# Patient Record
Sex: Male | Born: 1950 | Race: White | Hispanic: No | Marital: Married | Smoking: Former smoker
Health system: Southern US, Community
[De-identification: ages and names within clinical notes are randomized; demographics above are authoritative.]

## PROBLEM LIST (undated history)

## (undated) DIAGNOSIS — J449 Chronic obstructive pulmonary disease, unspecified: Secondary | ICD-10-CM

## (undated) DIAGNOSIS — E785 Hyperlipidemia, unspecified: Secondary | ICD-10-CM

## (undated) DIAGNOSIS — M1990 Unspecified osteoarthritis, unspecified site: Secondary | ICD-10-CM

## (undated) DIAGNOSIS — I10 Essential (primary) hypertension: Secondary | ICD-10-CM

## (undated) DIAGNOSIS — Z9109 Other allergy status, other than to drugs and biological substances: Secondary | ICD-10-CM

## (undated) HISTORY — PX: VASECTOMY: SHX75

---

## 2016-09-08 ENCOUNTER — Encounter: Payer: Self-pay | Admitting: Emergency Medicine

## 2016-09-08 ENCOUNTER — Emergency Department
Admission: EM | Admit: 2016-09-08 | Discharge: 2016-09-08 | Disposition: A | Discharge status: Home / Self Care | Payer: 59 | Attending: Emergency Medicine | Admitting: Emergency Medicine

## 2016-09-08 DIAGNOSIS — W270XXA Contact with workbench tool, initial encounter: Secondary | ICD-10-CM | POA: Diagnosis not present

## 2016-09-08 DIAGNOSIS — Y999 Unspecified external cause status: Secondary | ICD-10-CM | POA: Insufficient documentation

## 2016-09-08 DIAGNOSIS — Y92838 Other recreation area as the place of occurrence of the external cause: Secondary | ICD-10-CM | POA: Insufficient documentation

## 2016-09-08 DIAGNOSIS — Z23 Encounter for immunization: Secondary | ICD-10-CM | POA: Diagnosis not present

## 2016-09-08 DIAGNOSIS — Z87891 Personal history of nicotine dependence: Secondary | ICD-10-CM | POA: Diagnosis not present

## 2016-09-08 DIAGNOSIS — Y93H9 Activity, other involving exterior property and land maintenance, building and construction: Secondary | ICD-10-CM | POA: Diagnosis not present

## 2016-09-08 DIAGNOSIS — S6991XA Unspecified injury of right wrist, hand and finger(s), initial encounter: Secondary | ICD-10-CM | POA: Diagnosis present

## 2016-09-08 DIAGNOSIS — S61411A Laceration without foreign body of right hand, initial encounter: Secondary | ICD-10-CM

## 2016-09-08 MED ORDER — TETANUS-DIPHTH-ACELL PERTUSSIS 5-2.5-18.5 LF-MCG/0.5 IM SUSP
0.5000 mL | Freq: Once | INTRAMUSCULAR | Status: AC
  Administered 2016-09-08: 0.5 mL via INTRAMUSCULAR
  Filled 2016-09-08: qty 0.5

## 2016-09-08 MED ORDER — LIDOCAINE HCL (PF) 1 % IJ SOLN
5.0000 mL | Freq: Once | INTRAMUSCULAR | Status: AC
  Administered 2016-09-08: 5 mL
  Filled 2016-09-08: qty 5

## 2016-09-08 NOTE — Discharge Instructions (Signed)
Obtain a monitor the wound for signs of infection. If you notice developing infection do not hesitate to return to your primary care or the emergency department.  Return to your primary care or the emergency department for suture removal in 7 days.

## 2016-09-08 NOTE — ED Notes (Signed)
Pt verbalized understanding of discharge instructions. NAD at this time. 

## 2016-09-08 NOTE — ED Notes (Signed)
Signature pad not working. Hard copy printed and signed by patient.  

## 2016-09-08 NOTE — ED Triage Notes (Signed)
Jabbed R hand with screwdriver approx 30 min ago. Stuck fold between thumb and finger. Bleeding controlled.

## 2016-09-08 NOTE — ED Provider Notes (Signed)
[facility] Emergency Department Provider Note   ____________________________________________   I have reviewed the triage vital signs and the nursing notes.   HISTORY  Chief Complaint Laceration    HPI Christian Shelton is a 66 y.o. male presents to emergency department with small avulsed laceration he sustained while working on a pool earlier. Patient described the flat head screwdriver slipping while working on a pool pump sustaining the laceration. Patient reports maintaining hemorrhage control medially following the injury. He denies any loss of sensation or movement to the right hand or fingers. Patient does not recall his last tetanus shot.  Patient denies fever, chills, headache, vision changes, chest pain, chest tightness, shortness of breath, abdominal pain, nausea and vomiting.  History reviewed. No pertinent past medical history.  There are no active problems to display for this patient.   History reviewed. No pertinent surgical history.  Prior to Admission medications   Not on File    Allergies Celebrex [celecoxib] and Solu-medrol [methylprednisolone]  No family history on file.  Social History Social History  Substance Use Topics  . Smoking status: Former Games developermoker  . Smokeless tobacco: Not on file  . Alcohol use Not on file    Review of Systems Constitutional: Negative for fever/chills ENT:  Negative for sore throat and for difficulty swallowing Cardiovascular: Denies chest pain. Respiratory: Denies cough. Denies shortness of breath. Gastrointestinal: No abdominal pain.  No nausea, vomiting, diarrhea. Musculoskeletal: Negative for back pain. Skin: Negative for rash. Laceration along the right hand web space between the thumb and index finger.  Neurological: Negative for headaches.  ____________________________________________   PHYSICAL EXAM:  VITAL SIGNS: ED Triage Vitals  Enc Vitals Group     BP 09/08/16 1638 (!) 127/92       Pulse Rate 09/08/16 1638 94     Resp 09/08/16 1638 18     Temp 09/08/16 1638 (!) 97.5 F (36.4 C)     Temp Source 09/08/16 1638 Oral     SpO2 09/08/16 1638 94 %     Weight 09/08/16 1639 157 lb (71.2 kg)     Height 09/08/16 1639 5\' 5"  (1.651 m)     Head Circumference --      Peak Flow --      Pain Score 09/08/16 1638 2     Pain Loc --      Pain Edu? --      Excl. in GC? --     Constitutional: Alert and oriented. Well appearing and in no acute distress.  Head: Normocephalic and atraumatic. Eyes: Conjunctivae are normal. PERRL. Cardiovascular: Normal rate, regular rhythm. Normal distal pulses. Respiratory: Normal respiratory effort.  Gastrointestinal: Soft and nontender. Musculoskeletal: Right thumb and index finger range of motion, sensation and strength intact. No extensor tendon involvement. Nontender with normal range of motion in all extremities. Neurologic: Normal speech and language. Skin:  Skin is warm, dry and intact. No rash noted. Laceration approximately 1.5 cm, semicircle skin flap, irregular borders,along the right hand web space between the thumb and index finger. Psychiatric: Mood and affect are normal.  ____________________________________________   LABS (all labs ordered are listed, but only abnormal results are displayed)  Labs Reviewed - No data to display ____________________________________________  EKG none ____________________________________________  RADIOLOGY none ____________________________________________   PROCEDURES  Procedure(s) performed: LACERATION REPAIR Performed by: Clois Comberraci M Ayana Imhof Authorized by: Clois Comberraci M Cintya Daughety Consent: Verbal consent obtained. Risks and benefits: risks, benefits and alternatives were discussed Consent given by: patient Patient identity  confirmed: provided demographic data Prepped and Draped in normal sterile fashion Wound explored  Laceration Location: Right hand web space between right thumb and index  finger.  Laceration Length: 1.5 cm, semicircle skin flap, irregular borders.  No Foreign Bodies seen or palpated  Anesthesia: local infiltration  Local anesthetic: lidocaine 1%  Anesthetic total: 3.0 ml  Irrigation method: syringe Amount of cleaning: standard  Skin closure: Ethilon 5-0 and Vicryl rapide 5-0 Number of sutures: (4 sutures) Ethilon 5-0;  (2 sutures) Vicryl rapide 5-0   Technique: Subcutaneous and simple interrupted   Patient tolerance: Patient tolerated the procedure well with no immediate complications.   Critical Care performed: no ____________________________________________   INITIAL IMPRESSION / ASSESSMENT AND PLAN / ED COURSE  Pertinent labs & imaging results that were available during my care of the patient were reviewed by me and considered in my medical decision making (see chart for details).   Patient sustained a laceration along the webspace of the right hand between the right thumb and index finger.  Assessment confirmed movement and sensation of the digit before and after wound closure. Laceration required suture closure as noted above. Patient tolerated procedure well. Pt instructed to keep wound clean and dry and will return to the emergency department or PCP for suture removal in 7 days. Patient also instructed to watch for signs of infection and return to his PCP or the emergency department if changes are noted. Patient informed of clinical course, understand medical decision-making process, and agree with plan.      ____________________________________________   FINAL CLINICAL IMPRESSION(S) / ED DIAGNOSES  Final diagnoses:  Laceration of right hand without foreign body, initial encounter       NEW MEDICATIONS STARTED DURING THIS VISIT:  New Prescriptions   No medications on file     Note:  This document was prepared using Dragon voice recognition software and may include unintentional dictation errors.    Percell BostonLittle, Zeth Buday M,  PA-C 09/08/16 1852    Jeanmarie PlantMcShane, James A, MD 09/09/16 [phone]

## 2018-12-12 ENCOUNTER — Other Ambulatory Visit: Payer: Self-pay | Admitting: *Deleted

## 2018-12-12 DIAGNOSIS — Z20822 Contact with and (suspected) exposure to covid-19: Secondary | ICD-10-CM

## 2018-12-13 LAB — NOVEL CORONAVIRUS, NAA: SARS-CoV-2, NAA: NOT DETECTED

## 2018-12-23 ENCOUNTER — Other Ambulatory Visit: Payer: Self-pay

## 2018-12-23 DIAGNOSIS — Z20822 Contact with and (suspected) exposure to covid-19: Secondary | ICD-10-CM

## 2018-12-25 LAB — NOVEL CORONAVIRUS, NAA: SARS-CoV-2, NAA: DETECTED — AB

## 2019-04-15 ENCOUNTER — Other Ambulatory Visit: Payer: Self-pay | Admitting: Internal Medicine

## 2019-04-15 DIAGNOSIS — S32010A Wedge compression fracture of first lumbar vertebra, initial encounter for closed fracture: Secondary | ICD-10-CM

## 2019-04-23 ENCOUNTER — Ambulatory Visit
Admission: RE | Admit: 2019-04-23 | Discharge: 2019-04-23 | Disposition: A | Discharge status: Home / Self Care | Payer: Managed Care, Other (non HMO) | Source: Ambulatory Visit | Attending: Internal Medicine | Admitting: Internal Medicine

## 2019-04-23 ENCOUNTER — Other Ambulatory Visit: Payer: Self-pay

## 2019-04-23 DIAGNOSIS — S32010A Wedge compression fracture of first lumbar vertebra, initial encounter for closed fracture: Secondary | ICD-10-CM | POA: Diagnosis not present

## 2020-04-20 ENCOUNTER — Other Ambulatory Visit: Payer: Self-pay | Admitting: Internal Medicine

## 2020-04-20 DIAGNOSIS — J849 Interstitial pulmonary disease, unspecified: Secondary | ICD-10-CM

## 2020-05-06 ENCOUNTER — Ambulatory Visit: Payer: Managed Care, Other (non HMO)

## 2020-05-11 ENCOUNTER — Other Ambulatory Visit: Payer: Self-pay

## 2020-05-11 ENCOUNTER — Ambulatory Visit
Admission: RE | Admit: 2020-05-11 | Discharge: 2020-05-11 | Disposition: A | Discharge status: Home / Self Care | Payer: Managed Care, Other (non HMO) | Source: Ambulatory Visit | Attending: Internal Medicine | Admitting: Internal Medicine

## 2020-05-11 DIAGNOSIS — J849 Interstitial pulmonary disease, unspecified: Secondary | ICD-10-CM | POA: Diagnosis present

## 2020-05-11 MED ORDER — IOHEXOL 300 MG/ML  SOLN
75.0000 mL | Freq: Once | INTRAMUSCULAR | Status: AC | PRN
  Administered 2020-05-11: 75 mL via INTRAVENOUS

## 2021-02-19 ENCOUNTER — Ambulatory Visit
Admission: RE | Admit: 2021-02-19 | Discharge: 2021-02-19 | Disposition: A | Discharge status: Home / Self Care | Payer: Managed Care, Other (non HMO) | Source: Ambulatory Visit

## 2021-02-19 ENCOUNTER — Ambulatory Visit (INDEPENDENT_AMBULATORY_CARE_PROVIDER_SITE_OTHER): Payer: Managed Care, Other (non HMO)

## 2021-02-19 VITALS — BP 166/92 | HR 74 | Temp 98.1°F | Resp 18

## 2021-02-19 DIAGNOSIS — J441 Chronic obstructive pulmonary disease with (acute) exacerbation: Secondary | ICD-10-CM

## 2021-02-19 DIAGNOSIS — R059 Cough, unspecified: Secondary | ICD-10-CM | POA: Diagnosis not present

## 2021-02-19 DIAGNOSIS — Z8616 Personal history of COVID-19: Secondary | ICD-10-CM | POA: Diagnosis not present

## 2021-02-19 HISTORY — DX: Unspecified osteoarthritis, unspecified site: M19.90

## 2021-02-19 HISTORY — DX: Hyperlipidemia, unspecified: E78.5

## 2021-02-19 HISTORY — DX: Essential (primary) hypertension: I10

## 2021-02-19 HISTORY — DX: Other allergy status, other than to drugs and biological substances: Z91.09

## 2021-02-19 HISTORY — DX: Chronic obstructive pulmonary disease, unspecified: J44.9

## 2021-02-19 MED ORDER — PREDNISONE 10 MG PO TABS: ORAL_TABLET | ORAL | 0 refills | Status: DC

## 2021-02-19 MED ORDER — BENZONATATE 100 MG PO CAPS: 100.0000 mg | ORAL_CAPSULE | Freq: Three times a day (TID) | ORAL | 0 refills | Status: DC

## 2021-02-19 MED ORDER — DOXYCYCLINE HYCLATE 100 MG PO CAPS: 100.0000 mg | ORAL_CAPSULE | Freq: Two times a day (BID) | ORAL | 0 refills | Status: DC

## 2021-02-19 NOTE — ED Triage Notes (Signed)
Pt presents with cough and chest congestion x 3 weeks. Pt tested positive for Covid at home test on 12/11.

## 2021-02-19 NOTE — ED Provider Notes (Addendum)
Christian Shelton    CSN: [id number] Arrival date & time: 02/19/21  6295      History   Chief Complaint Chief Complaint  Patient presents with   Cough    HPI Christian Shelton is a 71 y.o. male. Pt presents with his son.   HPI  Cough: Patient with a PMH significant for COPD presents with cough and chest congestion for the past 3 weeks after having COVID-19. Cough his productive with green, brown sputum. No hemoptysis. He has had no fevers, chest pain or severe SOB. He has tired his normal inhalers along with roboussin and mucinex for symptoms.    Past Medical History:  Diagnosis Date   Benign hypertension    COPD (chronic obstructive pulmonary disease) (HCC)    Environmental allergies    Hyperlipemia    Osteoarthritis     There are no problems to display for this patient.   Past Surgical History:  Procedure Laterality Date   VASECTOMY       Home Medications    Prior to Admission medications   Medication Sig Start Date End Date Taking? Authorizing Provider  atorvastatin (LIPITOR) 20 MG tablet Take 1 tablet by mouth daily. 04/07/20  Yes [provider]  budesonide-formoterol (SYMBICORT) 160-4.5 MCG/ACT inhaler INHALE 2 INHALATIONS BY  MOUTH INTO THE LUNGS TWICE  DAILY 04/14/20  Yes [provider]  ipratropium-albuterol (DUONEB) 0.5-2.5 (3) MG/3ML SOLN USE 1 VIAL VIA NEBULIZER 4  TIMES DAILY 01/20/20  Yes [provider]  magnesium oxide (MAG-OX) 400 MG tablet Take by mouth. 10/24/20 10/24/21 Yes [provider]  montelukast (SINGULAIR) 10 MG tablet Take by mouth. 05/20/20 05/20/21 Yes [provider]  naproxen (NAPROSYN) 500 MG tablet Take 1 tablet by mouth 2 (two) times daily with a meal. 04/06/20  Yes [provider]  omeprazole (PRILOSEC) 40 MG capsule Take by mouth. 10/24/20 10/24/21 Yes [provider]  sildenafil (REVATIO) 20 MG tablet 3-5 tablets daily as needed for ED 10/24/20  Yes [provider]   albuterol (VENTOLIN HFA) 108 (90 Base) MCG/ACT inhaler Inhale into the lungs. 02/14/21   [provider]  budesonide (PULMICORT) 0.5 MG/2ML nebulizer solution Take by nebulization. 12/16/20   [provider]  colchicine 0.6 MG tablet Take 0.6 mg by mouth daily. 09/23/20   [provider]    Family History History reviewed. No pertinent family history.  Social History Social History   Tobacco Use   Smoking status: Former   Smokeless tobacco: Never  Building services engineer Use: Never used  Substance Use Topics   Alcohol use: Yes   Drug use: Never     Allergies   Celebrex [celecoxib] and Solu-medrol [methylprednisolone]   Review of Systems Review of Systems  As stated above in HPI Physical Exam Triage Vital Signs ED Triage Vitals  Enc Vitals Group     BP 02/19/21 0938 (!) 166/92     Pulse Rate 02/19/21 0938 74     Resp 02/19/21 0938 18     Temp 02/19/21 0938 98.1 F (36.7 C)     Temp Source 02/19/21 0938 Oral     SpO2 02/19/21 0938 92 %     Weight --      Height --      Head Circumference --      Peak Flow --      Pain Score 02/19/21 0940 0     Pain Loc --      Pain Edu? --  Excl. in GC? --    No data found.  Updated Vital Signs BP (!) 166/92 (BP Location: Left Arm)    Pulse 74    Temp 98.1 F (36.7 C) (Oral)    Resp 18    SpO2 92%   Physical Exam Vitals and nursing note reviewed.  Constitutional:      General: He is not in acute distress.    Appearance: He is well-developed.  HENT:     Head: Normocephalic and atraumatic.     Right Ear: Tympanic membrane normal.     Left Ear: Tympanic membrane normal.     Nose: Congestion present. No rhinorrhea.     Mouth/Throat:     Mouth: Mucous membranes are moist.     Pharynx: Oropharynx is clear. No oropharyngeal exudate or posterior oropharyngeal erythema.  Eyes:     Conjunctiva/sclera: Conjunctivae normal.  Cardiovascular:     Rate and Rhythm: Normal rate and regular rhythm.      Heart sounds: No murmur heard. Pulmonary:     Effort: Pulmonary effort is normal. No respiratory distress.     Breath sounds: Wheezing and rhonchi present.  Abdominal:     Palpations: Abdomen is soft.     Tenderness: There is no abdominal tenderness.  Musculoskeletal:        General: No swelling.     Cervical back: Neck supple.  Lymphadenopathy:     Cervical: No cervical adenopathy.  Skin:    General: Skin is warm and dry.     Capillary Refill: Capillary refill takes less than 2 seconds.  Neurological:     Mental Status: He is alert.  Psychiatric:        Mood and Affect: Mood normal.     UC Treatments / Results  Labs (all labs ordered are listed, but only abnormal results are displayed) Labs Reviewed - No data to display  EKG   Radiology No results found.  Procedures Procedures (including critical care time)  Medications Ordered in UC Medications - No data to display  Initial Impression / Assessment and Plan / UC Course  I have reviewed the triage vital signs and the nursing notes.  Pertinent labs & imaging results that were available during my care of the patient were reviewed by me and considered in my medical decision making (see chart for details).     New. COPD flare. They as for nipple markers for x ray which we do not have- follow up x ray image with PCP in 1 month. X ray shows no suspected pneumonia but given history and smoking status will treat concurrently with azithromycin for coverage for atypical bacteria. Treating with prednisone (which he has done well with in the past per patient and his son along with tessalon. Discussed red flag signs and symptoms along with O2 monitoring. Follow up with PCP our office,  or pulmonology PRN. Of note he reports history of white coat syndrome.   Final Clinical Impressions(s) / UC Diagnoses   Final diagnoses:  None   Discharge Instructions   None    ED Prescriptions   None    PDMP not reviewed this  encounter.   Rushie Chestnut, PA-C 02/19/21 1024    Rushie Chestnut, PA-C 02/19/21 1027

## 2021-02-21 IMAGING — MR MR LUMBAR SPINE W/O CM
5 series · 30 of 48 positions shown · non-contrast
Comparison: None.

CLINICAL DATA: Low back pain. Evaluate compression fracture seen on
x-ray.

EXAM:
MRI LUMBAR SPINE WITHOUT CONTRAST
TECHNIQUE: Multiplanar, multisequence MR imaging of the lumbar spine was
performed. No intravenous contrast was administered.

[Series 5: T2 · sagittal · 4.0mm · 0.81mm/px · 6 of 19 slices shown (1 of 2)]
[im 1/19]
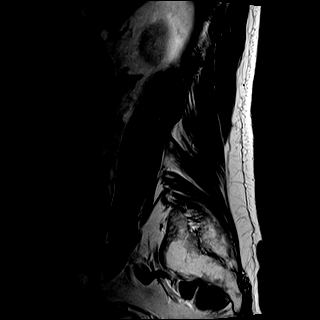
[im 4/19]
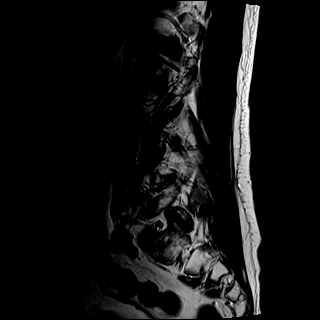
[im 8/19]
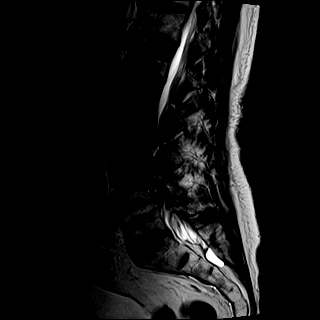
[im 11/19]
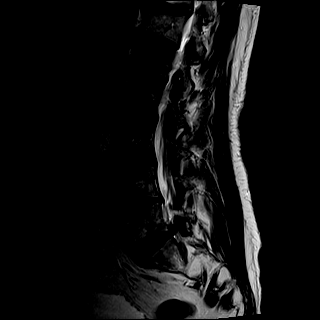
[im 15/19]
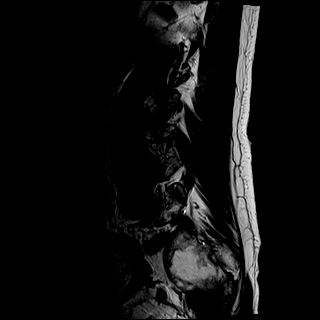
[im 19/19]
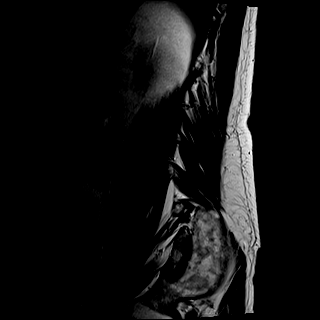

[Series 6: T1 · sagittal · 4.0mm · 0.81mm/px · 7 of 19 slices shown (1 of 2)]
[im 1/19]
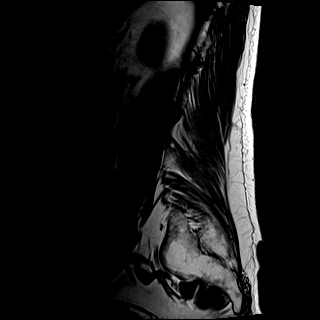
[im 4/19]
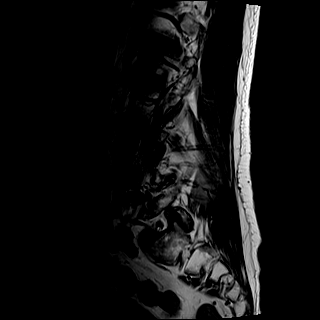
[im 7/19]
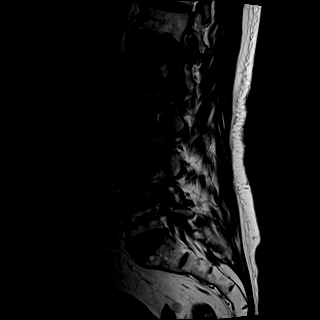
[im 10/19]
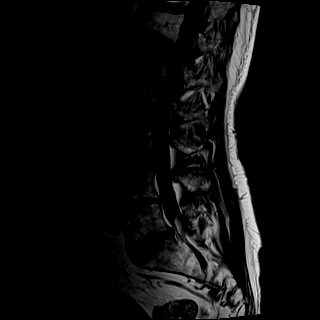
[im 13/19]
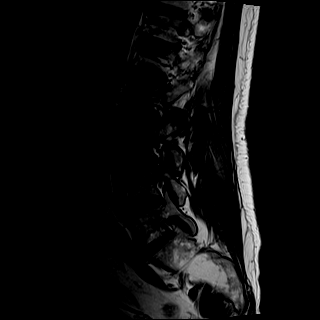
[im 16/19]
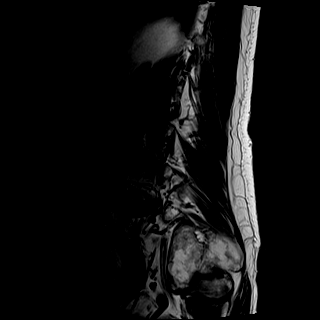
[im 19/19]
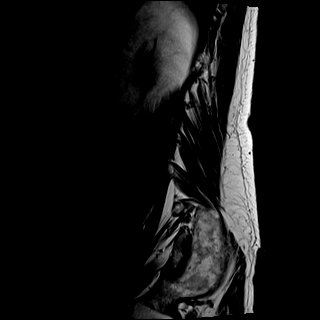

[Series 7: STIR · sagittal · 4.0mm · 0.41mm/px · 1 of 19 slices shown]
[im 1/19]
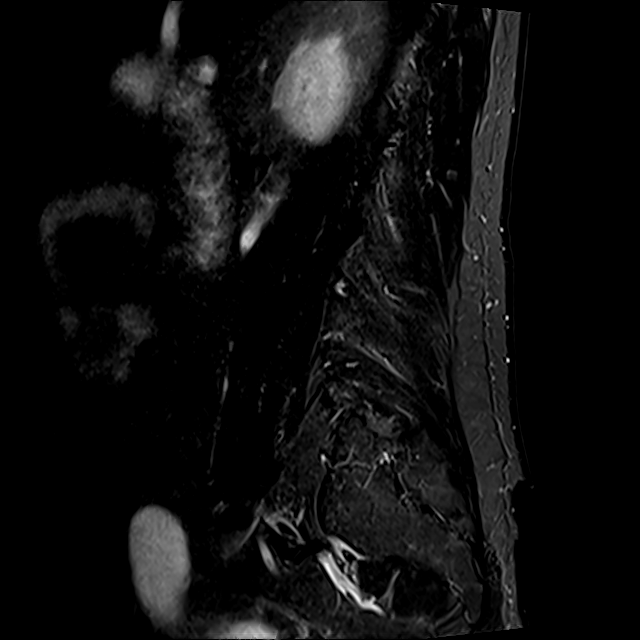

[Series 8: T2 · axial · 4.0mm · 0.78mm/px · z∈[-97,+123]mm · 8 of 40 slices shown (2 of 2)]
[im 1/40]
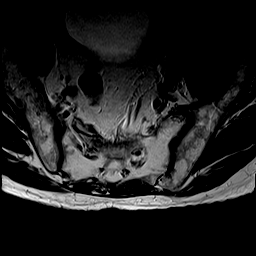
[im 7/40]
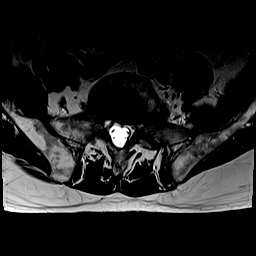
[im 13/40]
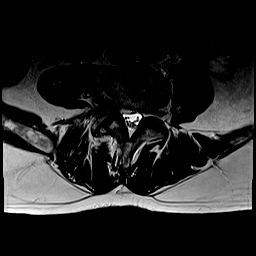
[im 19/40]
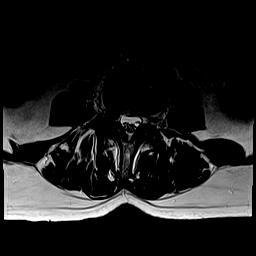
[im 22/40]
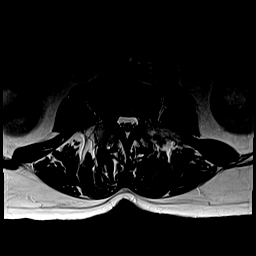
[im 28/40]
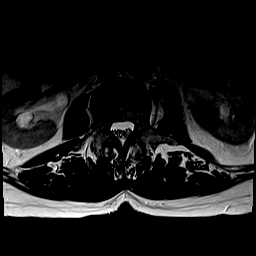
[im 34/40]
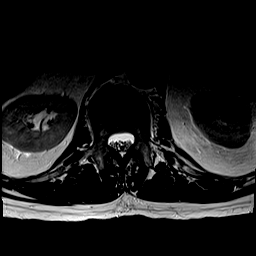
[im 40/40]
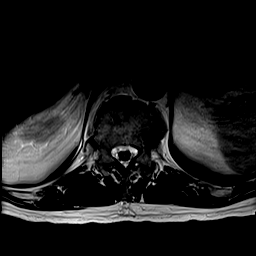

[Series 9: T1 · axial · 4.0mm · 0.39mm/px · z∈[-97,+123]mm · 8 of 40 slices shown (2 of 2)]
[im 1/40]
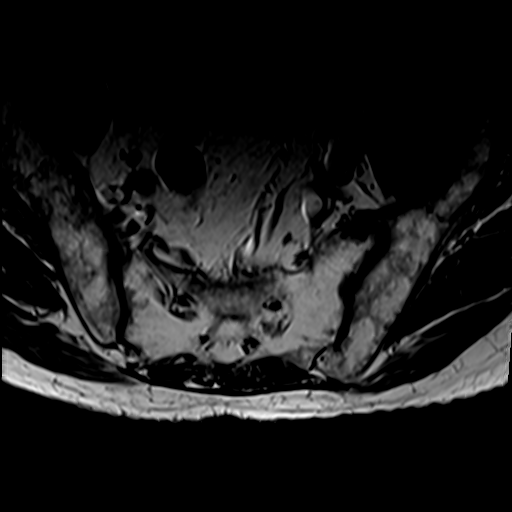
[im 7/40]
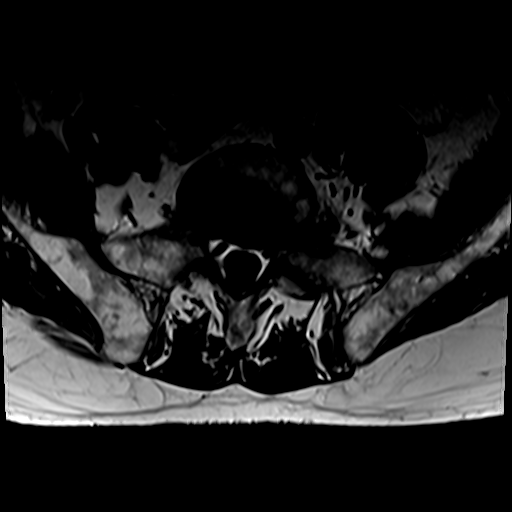
[im 13/40]
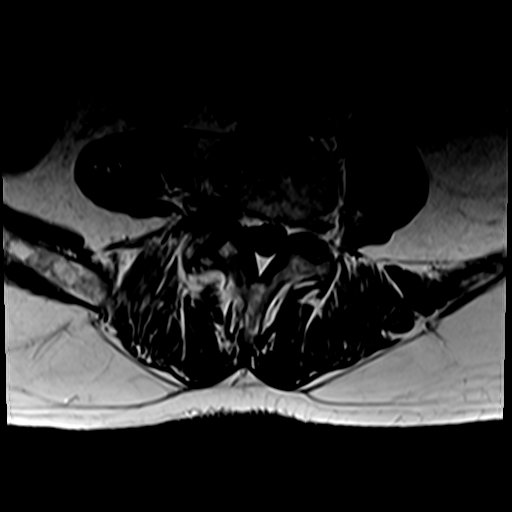
[im 19/40]
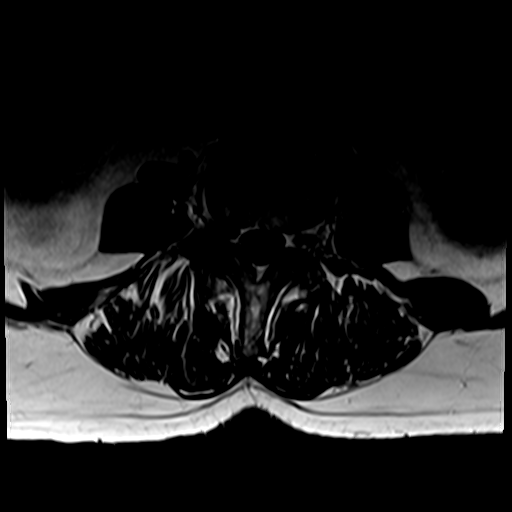
[im 22/40]
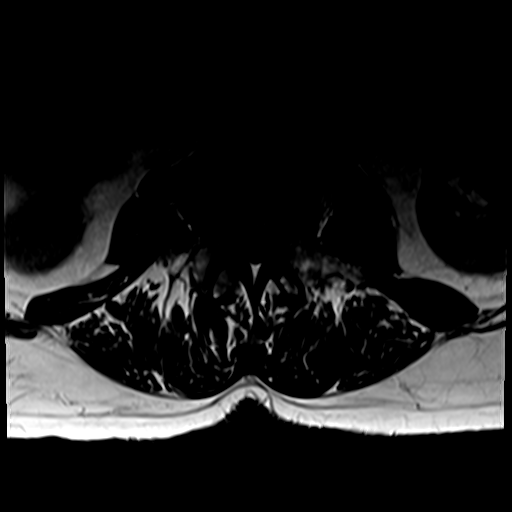
[im 28/40]
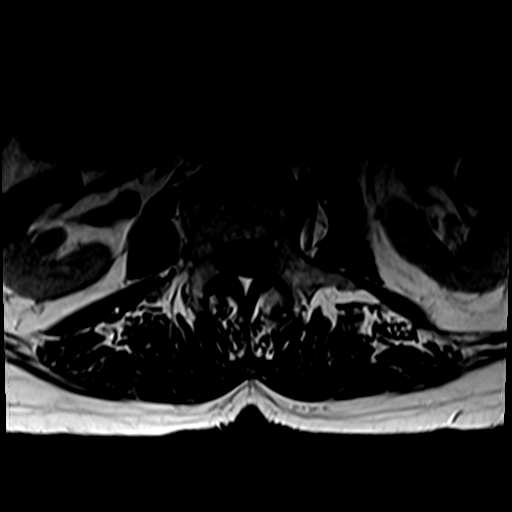
[im 34/40]
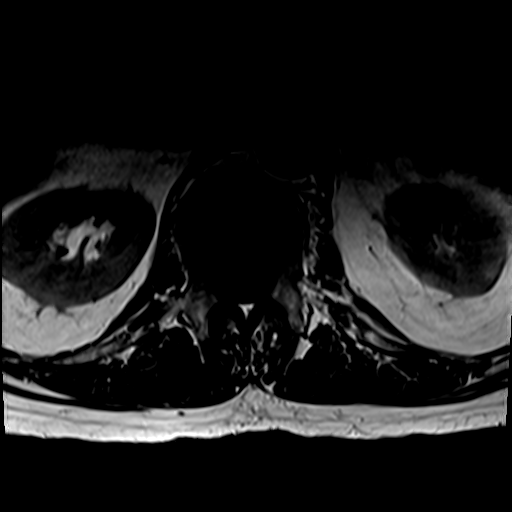
[im 40/40]
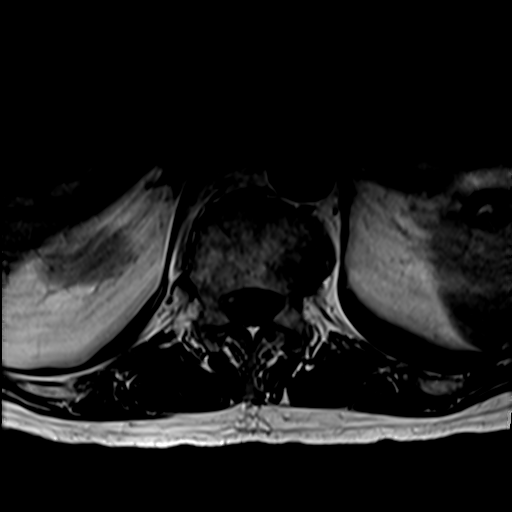

[30 of 48 positions shown; findings below may reference images not displayed]

FINDINGS: Segmentation:  Standard.

Alignment:  Physiologic.

Vertebrae: Compression fracture of the superior endplate of the L1
vertebral body with 50% loss of vertebral body height and associated
marrow edema, consistent with acute/subacute fracture.

Compression fracture of the superior endplate of T12 with mild
wedging and no marrow edema, consistent with chronic event. Minimal
endplate compression fractures at L3 and 4 without associated edema,
also chronic.

Endplate degenerative changes at L4-5 with loss of disc height and
osteophytes.

Conus medullaris and cauda equina: Conus extends to the L1 level.
Conus and cauda equina appear normal.

Paraspinal and other soft tissues: Infrarenal abdominal AA or
aneurysm measuring up to 4.2 cm with intramural hematoma.

Disc levels:

T12-L1: Shallow disc bulge and mild facet degenerative changes
resulting in minimal narrowing of the left neural foramen. No spinal
canal stenosis.

L1-2: Disc bulge and facet degenerative changes resulting in mild
left neural foraminal narrowing. No significant spinal canal
stenosis.

L2-3: Disc bulge, facet degenerative changes and ligamentum flavum
redundancy resulting in mild narrowing of the spinal canal with
narrowing of the left subarticular zone, mild right and moderate
left neural foraminal narrowing.

L3-4: Disc bulge with superimposed right foraminal disc protrusion,
facet degenerative changes and ligamentum flavum redundancy
resulting in mild spinal canal stenosis, narrowing of the
subarticular zones, right greater than left, moderate right mild
left neural foraminal narrowing.

L4-5: Right asymmetric disc bulge, mild facet degenerative changes
and ligamentum flavum redundancy resulting mild-to-moderate spinal
canal stenosis, effacement of the right subarticular zone, severe
right and moderate left neural foraminal narrowing.

L5-S1: Left asymmetric disc bulge and mild facet degenerative
changes resulting in narrowing of the left subarticular zone and
moderate narrowing of the left neural foramen. No spinal canal
stenosis.
IMPRESSION: 1. Acute/subacute L1 superior endplate compression fracture with 50%
loss of vertebral body height.
2. Chronic compression deformities of T12, L3, and 4 vertebral
bodies.
3. Multilevel degenerative changes of the lumbar spine with varying
degrees of spinal canal and neural foraminal narrowing, worst at
L4-5 where there is mild-to-moderate spinal canal stenosis,
effacement of the right subarticular zone, severe right and moderate
left neural foraminal narrowing.
4. Infrarenal abdominal aortic or aneurysm measuring up to 4.2 cm
with intramural hematoma. Recommend followup by ultrasound in 3
years. This recommendation follows ACR consensus guidelines: White
Paper of the ACR Incidental Findings Committee II on Vascular
Findings. [HOSPITAL] 5347; 10:

These results will be called to the ordering clinician or
representative by the Radiologist Assistant, and communication
documented in the PACS or [REDACTED].

## 2021-10-23 ENCOUNTER — Other Ambulatory Visit: Payer: Self-pay

## 2021-10-23 ENCOUNTER — Emergency Department
Admission: EM | Admit: 2021-10-23 | Discharge: 2021-10-23 | Disposition: A | Discharge status: Home / Self Care | Payer: Managed Care, Other (non HMO) | Attending: Emergency Medicine | Admitting: Emergency Medicine

## 2021-10-23 ENCOUNTER — Emergency Department: Payer: Managed Care, Other (non HMO)

## 2021-10-23 DIAGNOSIS — Z20822 Contact with and (suspected) exposure to covid-19: Secondary | ICD-10-CM | POA: Insufficient documentation

## 2021-10-23 DIAGNOSIS — R55 Syncope and collapse: Secondary | ICD-10-CM

## 2021-10-23 DIAGNOSIS — S0101XA Laceration without foreign body of scalp, initial encounter: Secondary | ICD-10-CM | POA: Diagnosis not present

## 2021-10-23 DIAGNOSIS — J449 Chronic obstructive pulmonary disease, unspecified: Secondary | ICD-10-CM | POA: Diagnosis not present

## 2021-10-23 DIAGNOSIS — S0990XA Unspecified injury of head, initial encounter: Secondary | ICD-10-CM | POA: Diagnosis present

## 2021-10-23 DIAGNOSIS — Z23 Encounter for immunization: Secondary | ICD-10-CM | POA: Diagnosis not present

## 2021-10-23 DIAGNOSIS — Y990 Civilian activity done for income or pay: Secondary | ICD-10-CM | POA: Diagnosis not present

## 2021-10-23 DIAGNOSIS — I10 Essential (primary) hypertension: Secondary | ICD-10-CM | POA: Insufficient documentation

## 2021-10-23 DIAGNOSIS — X58XXXA Exposure to other specified factors, initial encounter: Secondary | ICD-10-CM | POA: Diagnosis not present

## 2021-10-23 LAB — BASIC METABOLIC PANEL
Anion gap: 9 (ref 5–15)
BUN: 14 mg/dL (ref 8–23)
CO2: 29 mmol/L (ref 22–32)
Calcium: 9.1 mg/dL (ref 8.9–10.3)
Chloride: 101 mmol/L (ref 98–111)
Creatinine, Ser: 1.02 mg/dL (ref 0.61–1.24)
GFR, Estimated: >60 mL/min (ref >60)
Glucose, Bld: 132 mg/dL — ABNORMAL HIGH (ref 70–99)
Potassium: 3.7 mmol/L (ref 3.5–5.1)
Sodium: 139 mmol/L (ref 135–145)

## 2021-10-23 LAB — CBC
HCT: 49.7 % (ref 39.0–52.0)
Hemoglobin: 16.7 g/dL (ref 13.0–17.0)
MCH: 32.2 pg (ref 26.0–34.0)
MCHC: 33.6 g/dL (ref 30.0–36.0)
MCV: 95.8 fL (ref 80.0–100.0)
Platelets: 199 10*3/uL (ref 150–400)
RBC: 5.19 MIL/uL (ref 4.22–5.81)
RDW: 12.6 % (ref 11.5–15.5)
WBC: 6.4 10*3/uL (ref 4.0–10.5)
nRBC: 0 % (ref 0.0–0.2)

## 2021-10-23 LAB — TROPONIN I (HIGH SENSITIVITY): Troponin I (High Sensitivity): 3 ng/L (ref <18)

## 2021-10-23 LAB — SARS CORONAVIRUS 2 BY RT PCR: SARS Coronavirus 2 by RT PCR: NEGATIVE

## 2021-10-23 MED ORDER — ACETAMINOPHEN 500 MG PO TABS
1000.0000 mg | ORAL_TABLET | Freq: Once | ORAL | Status: AC
  Administered 2021-10-23: 1000 mg via ORAL
  Filled 2021-10-23: qty 2

## 2021-10-23 MED ORDER — TETANUS-DIPHTH-ACELL PERTUSSIS 5-2.5-18.5 LF-MCG/0.5 IM SUSY
0.5000 mL | PREFILLED_SYRINGE | Freq: Once | INTRAMUSCULAR | Status: AC
  Administered 2021-10-23: 0.5 mL via INTRAMUSCULAR
  Filled 2021-10-23: qty 0.5

## 2021-10-23 NOTE — ED Triage Notes (Signed)
Pt states woke up this morning "In a cold sweat" went to work, fainted at work. Pt states he felt dizzy before he had a syncopal episode. Pt with laceration noted to right parietal scalp and hematoma to right parietal scalp.

## 2021-10-23 NOTE — ED Provider Notes (Signed)
[facility] Provider Note    Event Date/Time   First MD Initiated Contact with Patient 10/23/21 0703     (approximate)   History   Chief Complaint: Fall, Dizziness, and Loss of Consciousness   HPI  Christian Shelton is a 71 y.o. male with a history of COPD hyperlipidemia hypertension who comes the ED due to syncope and head injury.  He reports being in his usual state of health until waking up this morning when he felt he had chills and sweats.  He went to work this morning, and while standing in the staff lounge waiting to clock in to start the day, he got lightheaded and passed out.  Complains of generalized headache that started after regaining consciousness.  Denies any preceding symptoms such as chest pain shortness of breath abdominal pain back pain double vision severe headache or neck pain.  Currently feels back to normal except for headache.  Does not take blood thinners.  Last tetanus shot was more than 5 years ago.     Physical Exam   Triage Vital Signs: ED Triage Vitals  Enc Vitals Group     BP 10/23/21 0644 (!) 154/88     Pulse Rate 10/23/21 0644 68     Resp 10/23/21 0644 18     Temp 10/23/21 0826 98.2 F (36.8 C)     Temp Source 10/23/21 0644 Oral     SpO2 10/23/21 0644 95 %     Weight 10/23/21 0644 154 lb (69.9 kg)     Height 10/23/21 0644 5\' 6"  (1.676 m)     Head Circumference --      Peak Flow --      Pain Score 10/23/21 0644 10     Pain Loc --      Pain Edu? --      Excl. in GC? --     Most recent vital signs: Vitals:   10/23/21 0644 10/23/21 0826  BP: (!) 154/88   Pulse: 68   Resp: 18   Temp:  98.2 F (36.8 C)  SpO2: 95%     General: Awake, no distress.  CV:  Good peripheral perfusion.  Regular rate and rhythm Resp:  Normal effort.  Clear to auscultation bilaterally Abd:  No distention.  Soft nontender Other:  Full range of motion all extremities. Left scalp has a 4 cm tender subcutaneous hematoma.  No bleeding, not  expanding.  Right temporal scalp has a 4 cm linear laceration which is hemostatic.  No deformity/depression or point tenderness   ED Results / Procedures / Treatments   Labs (all labs ordered are listed, but only abnormal results are displayed) Labs Reviewed  BASIC METABOLIC PANEL - Abnormal; Notable for the following components:      Result Value   Glucose, Bld 132 (*)    All other components within normal limits  SARS CORONAVIRUS 2 BY RT PCR  CBC  TROPONIN I (HIGH SENSITIVITY)  TROPONIN I (HIGH SENSITIVITY)     EKG    RADIOLOGY CT head interpreted by me, negative for intracranial hemorrhage.  Radiology report reviewed.  CT cervical spine unremarkable   PROCEDURES:  .1-3 Lead EKG Interpretation  Performed by: 12/23/21, MD Authorized by: Sharman Cheek, MD     Interpretation: normal     ECG rate:  70   ECG rate assessment: normal     Rhythm: sinus rhythm     Ectopy: none     Conduction: normal  Comments:        Marland KitchenMarland KitchenLaceration Repair  Date/Time: 10/23/2021 9:06 AM  Performed by: Sharman Cheek, MD Authorized by: Sharman Cheek, MD   Consent:    Consent obtained:  Verbal   Consent given by:  Patient   Risks discussed:  Need for additional repair and poor wound healing Universal protocol:    Patient identity confirmed:  Verbally with patient Anesthesia:    Anesthesia method:  None Laceration details:    Location:  Scalp   Scalp location:  R temporal   Length (cm):  4 Pre-procedure details:    Preparation:  Imaging obtained to evaluate for foreign bodies and patient was prepped and draped in usual sterile fashion Exploration:    Hemostasis achieved with:  Direct pressure   Imaging outcome: foreign body not noted     Wound exploration: entire depth of wound visualized     Wound extent: no fascia violation noted, no foreign bodies/material noted, no muscle damage noted, no nerve damage noted, no tendon damage noted, no underlying fracture  noted and no vascular damage noted     Contaminated: no   Treatment:    Area cleansed with:  Povidone-iodine   Amount of cleaning:  Standard   Irrigation solution:  Sterile saline   Irrigation method:  Pressure wash   Visualized foreign bodies/material removed: no     Debridement:  Minimal   Undermining:  None   Scar revision: no   Skin repair:    Repair method:  Tissue adhesive Approximation:    Approximation:  Close Repair type:    Repair type:  Simple Post-procedure details:    Dressing:  Open (no dressing)   Procedure completion:  Tolerated well, no immediate complications    MEDICATIONS ORDERED IN ED: Medications  acetaminophen (TYLENOL) tablet 1,000 mg (1,000 mg Oral Given 10/23/21 0817)  Tdap (BOOSTRIX) injection 0.5 mL (0.5 mLs Intramuscular Given 10/23/21 0817)     IMPRESSION / MDM / ASSESSMENT AND PLAN / ED COURSE  I reviewed the triage vital signs and the nursing notes.                              Differential diagnosis includes, but is not limited to, intracranial hemorrhage, skull fracture, C-spine fracture, dehydration, electrolyte abnormality, AKI, COVID/viral illness  Patient's presentation is most consistent with acute complicated illness / injury requiring diagnostic workup.  Patient presents with syncope which occurred after starting some mild constitutional symptoms this morning.  No focal pain syndrome.  Presentation is noncardiac, doubt ACS PE dissection or stroke.  CT imaging negative for any serious injuries.  Tetanus updated.  Wound cleaned and repaired with tissue adhesive after discussion of options with the patient.  We will send COVID test, prescribe Paxlovid if needed.       FINAL CLINICAL IMPRESSION(S) / ED DIAGNOSES   Final diagnoses:  Syncope, unspecified syncope type  Scalp laceration, initial encounter     Rx / DC Orders   ED Discharge Orders     None        Note:  This document was prepared using Dragon voice  recognition software and may include unintentional dictation errors.   Sharman Cheek, MD 10/23/21 [phone]

## 2021-10-23 NOTE — ED Notes (Signed)
Assumed care, pt alert and oriented. Family at bedside. Reports headache from fall. MD aware will monitor.

## 2022-02-09 ENCOUNTER — Emergency Department: Payer: Managed Care, Other (non HMO)

## 2022-02-09 ENCOUNTER — Inpatient Hospital Stay
Admission: EM | Admit: 2022-02-09 | Discharge: 2022-02-11 | DRG: 190 | Disposition: A | Discharge status: Home / Self Care | Payer: Managed Care, Other (non HMO) | Attending: Internal Medicine | Admitting: Internal Medicine

## 2022-02-09 DIAGNOSIS — J9621 Acute and chronic respiratory failure with hypoxia: Secondary | ICD-10-CM | POA: Diagnosis present

## 2022-02-09 DIAGNOSIS — I10 Essential (primary) hypertension: Secondary | ICD-10-CM | POA: Diagnosis present

## 2022-02-09 DIAGNOSIS — J441 Chronic obstructive pulmonary disease with (acute) exacerbation: Secondary | ICD-10-CM | POA: Diagnosis present

## 2022-02-09 DIAGNOSIS — E785 Hyperlipidemia, unspecified: Secondary | ICD-10-CM | POA: Diagnosis present

## 2022-02-09 DIAGNOSIS — Z7951 Long term (current) use of inhaled steroids: Secondary | ICD-10-CM | POA: Diagnosis not present

## 2022-02-09 DIAGNOSIS — J9601 Acute respiratory failure with hypoxia: Principal | ICD-10-CM

## 2022-02-09 DIAGNOSIS — J440 Chronic obstructive pulmonary disease with acute lower respiratory infection: Secondary | ICD-10-CM | POA: Diagnosis present

## 2022-02-09 DIAGNOSIS — E8809 Other disorders of plasma-protein metabolism, not elsewhere classified: Secondary | ICD-10-CM | POA: Diagnosis present

## 2022-02-09 DIAGNOSIS — Z888 Allergy status to other drugs, medicaments and biological substances status: Secondary | ICD-10-CM | POA: Diagnosis not present

## 2022-02-09 DIAGNOSIS — E872 Acidosis, unspecified: Secondary | ICD-10-CM | POA: Diagnosis present

## 2022-02-09 DIAGNOSIS — M1990 Unspecified osteoarthritis, unspecified site: Secondary | ICD-10-CM | POA: Diagnosis present

## 2022-02-09 DIAGNOSIS — J180 Bronchopneumonia, unspecified organism: Secondary | ICD-10-CM | POA: Diagnosis not present

## 2022-02-09 DIAGNOSIS — Z87891 Personal history of nicotine dependence: Secondary | ICD-10-CM

## 2022-02-09 DIAGNOSIS — R0603 Acute respiratory distress: Secondary | ICD-10-CM | POA: Diagnosis present

## 2022-02-09 DIAGNOSIS — J9600 Acute respiratory failure, unspecified whether with hypoxia or hypercapnia: Secondary | ICD-10-CM | POA: Diagnosis present

## 2022-02-09 DIAGNOSIS — M109 Gout, unspecified: Secondary | ICD-10-CM | POA: Diagnosis present

## 2022-02-09 DIAGNOSIS — Z79899 Other long term (current) drug therapy: Secondary | ICD-10-CM | POA: Diagnosis not present

## 2022-02-09 DIAGNOSIS — D72829 Elevated white blood cell count, unspecified: Secondary | ICD-10-CM | POA: Diagnosis not present

## 2022-02-09 DIAGNOSIS — Z1152 Encounter for screening for COVID-19: Secondary | ICD-10-CM | POA: Diagnosis not present

## 2022-02-09 LAB — COMPREHENSIVE METABOLIC PANEL
ALT: 25 U/L (ref 0–44)
AST: 24 U/L (ref 15–41)
Albumin: 3.4 g/dL — ABNORMAL LOW (ref 3.5–5.0)
Alkaline Phosphatase: 65 U/L (ref 38–126)
Anion gap: 7 (ref 5–15)
BUN: 16 mg/dL (ref 8–23)
CO2: 22 mmol/L (ref 22–32)
Calcium: 8.3 mg/dL — ABNORMAL LOW (ref 8.9–10.3)
Chloride: 112 mmol/L — ABNORMAL HIGH (ref 98–111)
Creatinine, Ser: 0.7 mg/dL (ref 0.61–1.24)
GFR, Estimated: >60 mL/min (ref >60)
Glucose, Bld: 143 mg/dL — ABNORMAL HIGH (ref 70–99)
Potassium: 3.8 mmol/L (ref 3.5–5.1)
Sodium: 141 mmol/L (ref 135–145)
Total Bilirubin: 0.9 mg/dL (ref 0.3–1.2)
Total Protein: 6 g/dL — ABNORMAL LOW (ref 6.5–8.1)

## 2022-02-09 LAB — CBC WITH DIFFERENTIAL/PLATELET
Abs Immature Granulocytes: 0.03 10*3/uL (ref 0.00–0.07)
Basophils Absolute: 0 10*3/uL (ref 0.0–0.1)
Basophils Relative: 0 %
Eosinophils Absolute: 0.2 10*3/uL (ref 0.0–0.5)
Eosinophils Relative: 3 %
HCT: 47.9 % (ref 39.0–52.0)
Hemoglobin: 16.2 g/dL (ref 13.0–17.0)
Immature Granulocytes: 0 %
Lymphocytes Relative: 8 %
Lymphs Abs: 0.6 10*3/uL — ABNORMAL LOW (ref 0.7–4.0)
MCH: 33.3 pg (ref 26.0–34.0)
MCHC: 33.8 g/dL (ref 30.0–36.0)
MCV: 98.6 fL (ref 80.0–100.0)
Monocytes Absolute: 0.4 10*3/uL (ref 0.1–1.0)
Monocytes Relative: 5 %
Neutro Abs: 6.4 10*3/uL (ref 1.7–7.7)
Neutrophils Relative %: 84 %
Platelets: 214 10*3/uL (ref 150–400)
RBC: 4.86 MIL/uL (ref 4.22–5.81)
RDW: 12.9 % (ref 11.5–15.5)
WBC: 7.6 10*3/uL (ref 4.0–10.5)
nRBC: 0 % (ref 0.0–0.2)

## 2022-02-09 LAB — URINALYSIS, ROUTINE W REFLEX MICROSCOPIC
Bilirubin Urine: NEGATIVE
Glucose, UA: 50 mg/dL — AB
Hgb urine dipstick: NEGATIVE
Ketones, ur: NEGATIVE mg/dL
Leukocytes,Ua: NEGATIVE
Nitrite: NEGATIVE
Protein, ur: NEGATIVE mg/dL
Specific Gravity, Urine: 1.025 (ref 1.005–1.030)
pH: 6 (ref 5.0–8.0)

## 2022-02-09 LAB — CBC
HCT: 46.9 % (ref 39.0–52.0)
Hemoglobin: 15.6 g/dL (ref 13.0–17.0)
MCH: 32.6 pg (ref 26.0–34.0)
MCHC: 33.3 g/dL (ref 30.0–36.0)
MCV: 97.9 fL (ref 80.0–100.0)
Platelets: 222 10*3/uL (ref 150–400)
RBC: 4.79 MIL/uL (ref 4.22–5.81)
RDW: 13 % (ref 11.5–15.5)
WBC: 13.5 10*3/uL — ABNORMAL HIGH (ref 4.0–10.5)
nRBC: 0 % (ref 0.0–0.2)

## 2022-02-09 LAB — RESPIRATORY PANEL BY PCR

## 2022-02-09 LAB — RESP PANEL BY RT-PCR (RSV, FLU A&B, COVID)  RVPGX2
Influenza A by PCR: NEGATIVE
Influenza B by PCR: NEGATIVE
Resp Syncytial Virus by PCR: NEGATIVE
SARS Coronavirus 2 by RT PCR: NEGATIVE

## 2022-02-09 LAB — BLOOD GAS, VENOUS
Acid-Base Excess: 3 mmol/L — ABNORMAL HIGH (ref 0.0–2.0)
Bicarbonate: 29 mmol/L — ABNORMAL HIGH (ref 20.0–28.0)
O2 Saturation: 67.8 %
Patient temperature: 37
pCO2, Ven: 49 mmHg (ref 44–60)
pH, Ven: 7.38 (ref 7.25–7.43)
pO2, Ven: 46 mmHg — ABNORMAL HIGH (ref 32–45)

## 2022-02-09 LAB — TROPONIN I (HIGH SENSITIVITY)
Troponin I (High Sensitivity): 5 ng/L (ref <18)
Troponin I (High Sensitivity): 11 ng/L (ref <18)

## 2022-02-09 LAB — FERRITIN: Ferritin: 122 ng/mL (ref 24–336)

## 2022-02-09 LAB — LACTIC ACID, PLASMA
Lactic Acid, Venous: 2.4 mmol/L (ref 0.5–1.9)
Lactic Acid, Venous: 1.1 mmol/L (ref 0.5–1.9)

## 2022-02-09 LAB — BRAIN NATRIURETIC PEPTIDE: B Natriuretic Peptide: 68.4 pg/mL (ref 0.0–100.0)

## 2022-02-09 LAB — CREATININE, SERUM
Creatinine, Ser: 0.73 mg/dL (ref 0.61–1.24)
GFR, Estimated: >60 mL/min (ref >60)

## 2022-02-09 LAB — LIPASE, BLOOD: Lipase: 32 U/L (ref 11–51)

## 2022-02-09 LAB — C-REACTIVE PROTEIN: CRP: 7.1 mg/dL — ABNORMAL HIGH (ref <1.0)

## 2022-02-09 LAB — HIV ANTIBODY (ROUTINE TESTING W REFLEX): HIV Screen 4th Generation wRfx: NONREACTIVE

## 2022-02-09 MED ORDER — ATORVASTATIN CALCIUM 20 MG PO TABS
20.0000 mg | ORAL_TABLET | Freq: Every day | ORAL | Status: DC
  Administered 2022-02-09 – 2022-02-10 (×2): 20 mg via ORAL
  Filled 2022-02-09 (×2): qty 1

## 2022-02-09 MED ORDER — ACETAMINOPHEN 650 MG RE SUPP: 650.0000 mg | Freq: Four times a day (QID) | RECTAL | Status: DC | PRN

## 2022-02-09 MED ORDER — COLCHICINE 0.6 MG PO TABS: 0.6000 mg | ORAL_TABLET | Freq: Every day | ORAL | Status: DC

## 2022-02-09 MED ORDER — BUDESONIDE 0.25 MG/2ML IN SUSP
0.2500 mg | Freq: Two times a day (BID) | RESPIRATORY_TRACT | Status: DC
  Administered 2022-02-09 – 2022-02-10 (×4): 0.25 mg via RESPIRATORY_TRACT
  Filled 2022-02-09 (×4): qty 2

## 2022-02-09 MED ORDER — SODIUM CHLORIDE 0.9 % IV SOLN
1.0000 g | Freq: Once | INTRAVENOUS | Status: AC
  Administered 2022-02-09: 1 g via INTRAVENOUS
  Filled 2022-02-09: qty 10

## 2022-02-09 MED ORDER — ONDANSETRON HCL 4 MG PO TABS: 4.0000 mg | ORAL_TABLET | Freq: Four times a day (QID) | ORAL | Status: DC | PRN

## 2022-02-09 MED ORDER — IPRATROPIUM-ALBUTEROL 0.5-2.5 (3) MG/3ML IN SOLN: 3.0000 mL | RESPIRATORY_TRACT | Status: DC | PRN

## 2022-02-09 MED ORDER — ENOXAPARIN SODIUM 40 MG/0.4ML IJ SOSY
40.0000 mg | PREFILLED_SYRINGE | INTRAMUSCULAR | Status: DC
  Administered 2022-02-09 – 2022-02-10 (×2): 40 mg via SUBCUTANEOUS
  Filled 2022-02-09 (×2): qty 0.4

## 2022-02-09 MED ORDER — VANCOMYCIN HCL 1500 MG/300ML IV SOLN
1500.0000 mg | Freq: Once | INTRAVENOUS | Status: AC
  Administered 2022-02-09: 1500 mg via INTRAVENOUS
  Filled 2022-02-09 (×2): qty 300

## 2022-02-09 MED ORDER — PREDNISONE 50 MG PO TABS
50.0000 mg | ORAL_TABLET | Freq: Every day | ORAL | Status: DC
  Administered 2022-02-10 – 2022-02-11 (×2): 50 mg via ORAL
  Filled 2022-02-09 (×4): qty 1

## 2022-02-09 MED ORDER — DEXAMETHASONE SODIUM PHOSPHATE 10 MG/ML IJ SOLN
10.0000 mg | Freq: Once | INTRAMUSCULAR | Status: AC
  Administered 2022-02-09: 10 mg via INTRAVENOUS
  Filled 2022-02-09: qty 1

## 2022-02-09 MED ORDER — SODIUM CHLORIDE 0.9 % IV SOLN
2.0000 g | Freq: Three times a day (TID) | INTRAVENOUS | Status: DC
  Administered 2022-02-09 – 2022-02-10 (×3): 2 g via INTRAVENOUS
  Filled 2022-02-09 (×3): qty 12.5

## 2022-02-09 MED ORDER — LEVALBUTEROL HCL 0.63 MG/3ML IN NEBU
0.6300 mg | INHALATION_SOLUTION | Freq: Four times a day (QID) | RESPIRATORY_TRACT | Status: DC
  Administered 2022-02-09 – 2022-02-10 (×3): 0.63 mg via RESPIRATORY_TRACT
  Filled 2022-02-09 (×4): qty 3

## 2022-02-09 MED ORDER — ONDANSETRON HCL 4 MG/2ML IJ SOLN: 4.0000 mg | Freq: Four times a day (QID) | INTRAMUSCULAR | Status: DC | PRN

## 2022-02-09 MED ORDER — VANCOMYCIN HCL 750 MG/150ML IV SOLN
750.0000 mg | Freq: Two times a day (BID) | INTRAVENOUS | Status: DC
  Administered 2022-02-09: 750 mg via INTRAVENOUS
  Filled 2022-02-09 (×2): qty 150

## 2022-02-09 MED ORDER — GUAIFENESIN ER 600 MG PO TB12
1200.0000 mg | ORAL_TABLET | Freq: Two times a day (BID) | ORAL | Status: DC
  Administered 2022-02-09 – 2022-02-11 (×4): 1200 mg via ORAL
  Filled 2022-02-09 (×5): qty 2

## 2022-02-09 MED ORDER — IPRATROPIUM BROMIDE 0.02 % IN SOLN
0.5000 mg | Freq: Four times a day (QID) | RESPIRATORY_TRACT | Status: DC
  Administered 2022-02-09 – 2022-02-10 (×3): 0.5 mg via RESPIRATORY_TRACT
  Filled 2022-02-09 (×4): qty 2.5

## 2022-02-09 MED ORDER — LACTATED RINGERS IV BOLUS (SEPSIS)
1000.0000 mL | Freq: Once | INTRAVENOUS | Status: AC
  Administered 2022-02-09: 1000 mL via INTRAVENOUS

## 2022-02-09 MED ORDER — ACETAMINOPHEN 325 MG PO TABS: 650.0000 mg | ORAL_TABLET | Freq: Four times a day (QID) | ORAL | Status: DC | PRN

## 2022-02-09 NOTE — H&P (Addendum)
History and Physical    Patient: Christian Shelton:[id number] DOB: [birth year] DOA: 02/09/2022 DOS: the patient was seen and examined on 02/09/2022 PCP: Marguarite Arbour, MD  Patient coming from: Home lives with wife and adult son  Chief Complaint:  Chief Complaint  Patient presents with   Respiratory Distress   HPI: Christian Shelton is a 71 y.o. male with medical history significant of hypertension, COPD not on oxygen, dyslipidemia and osteoarthritis.  Patient reports that for about 3 days prior to coming in he had fever and chills with nonproductive cough.  Generalized feeling of malaise.  EMS was called and upon evaluation his room air O2 sats were found to be 60%.  He was given nebulizer and started on CPAP in the field.  He was noted to have significant difficulty talking secondary to dyspnea.  After arrival to the ER he continued to have increased work of breathing so he was transitioned to BiPAP with an FiO2 of 35%.  His initial lactic acid was 2.4.  He did not have any leukocytosis.  His BNP was normal at 68.4.  Venous gas revealed a PaO2 of 46 with a normal pH.  COVID, flu, RSV PCR's were all negative.  Chest x-ray concerning for severe bronchitis with evolving multilobar bilateral bronchopneumonia right greater than the left.  Upon my evaluation of the patient he was alert without increased work of breathing on BiPAP.  No specific complaints.   Review of Systems: As mentioned in the history of present illness. All other systems reviewed and are negative. Past Medical History:  Diagnosis Date   Benign hypertension    COPD (chronic obstructive pulmonary disease) (HCC)    Environmental allergies    Hyperlipemia    Osteoarthritis    Past Surgical History:  Procedure Laterality Date   VASECTOMY     Social History:  reports that he has quit smoking. He has never used smokeless tobacco. He reports current alcohol use. He reports that he does not use drugs.  Allergies  Allergen  Reactions   Celebrex [Celecoxib] Nausea Only   Solu-Medrol [Methylprednisolone] Other (See Comments)    syncope   FAMILY HISTORY History reviewed. No pertinent family history related to patient's presenting complaint.  Prior to Admission medications   Medication Sig Start Date End Date Taking? Authorizing Provider  albuterol (VENTOLIN HFA) 108 (90 Base) MCG/ACT inhaler Inhale into the lungs. 02/14/21   [provider]  atorvastatin (LIPITOR) 20 MG tablet Take 1 tablet by mouth daily. 04/07/20   [provider]  benzonatate (TESSALON) 100 MG capsule Take 1 capsule (100 mg total) by mouth every 8 (eight) hours. 02/19/21   Rushie Chestnut, PA-C  budesonide (PULMICORT) 0.5 MG/2ML nebulizer solution Take by nebulization. 12/16/20   [provider]  budesonide-formoterol (SYMBICORT) 160-4.5 MCG/ACT inhaler INHALE 2 INHALATIONS BY  MOUTH INTO THE LUNGS TWICE  DAILY 04/14/20   [provider]  colchicine 0.6 MG tablet Take 0.6 mg by mouth daily. 09/23/20   [provider]  doxycycline (VIBRAMYCIN) 100 MG capsule Take 1 capsule (100 mg total) by mouth 2 (two) times daily. 02/19/21   Rushie Chestnut, PA-C  ipratropium-albuterol (DUONEB) 0.5-2.5 (3) MG/3ML SOLN USE 1 VIAL VIA NEBULIZER 4  TIMES DAILY 01/20/20   [provider]  montelukast (SINGULAIR) 10 MG tablet Take by mouth. 05/20/20 05/20/21  [provider]  naproxen (NAPROSYN) 500 MG tablet Take 1 tablet by mouth 2 (two) times daily with a meal. 04/06/20  [provider]  omeprazole (PRILOSEC) 40 MG capsule Take by mouth. 10/24/20 10/24/21  [provider]  predniSONE (DELTASONE) 10 MG tablet Take 4 tablets by mouth with breakfast for 2 days, 2 tablets by mouth for 2 days and 1 tablet by mouth for 2 days. 02/19/21   Rushie Chestnut, PA-C  sildenafil (REVATIO) 20 MG tablet 3-5 tablets daily as needed for ED 10/24/20   [provider]    Physical Exam: Vitals:    02/09/22 0530 02/09/22 0600 02/09/22 0630 02/09/22 0730  BP: 138/79 128/81 123/81 113/74  Pulse: 88 83 87 94  Resp: 17 14 (!) 24 17  Temp:      TempSrc:      SpO2: 96% 95% 94% 94%  Weight:      Height:       Constitutional: NAD, calm, comfortable Eyes: PERRL, lids and conjunctivae normal ENMT: Mucous membranes are moist. Posterior pharynx clear of any exudate or lesions.Normal dentition.  Neck: normal, supple, no masses, no thyromegaly Respiratory: clear to auscultation bilaterally on anterior exam, no wheezing, no crackles. Normal respiratory effort. No accessory muscle use.  BiPAP with FiO2 35% Cardiovascular: Regular rate and rhythm, no murmurs / rubs / gallops. No extremity edema. 2+ pedal pulses.  Abdomen: no tenderness, no masses palpated. Bowel sounds positive.  Musculoskeletal: no clubbing / cyanosis. No joint deformity upper and lower extremities. Good ROM, no contractures. Normal muscle tone.  Skin: no rashes, lesions, ulcers. No induration Neurologic: CN 2-12 grossly intact. Sensation intact, DTR normal. Strength 5/5 x all 4 extremities.  Psychiatric: Normal judgment and insight. Alert and oriented x 3. Normal mood.   Data Reviewed:  Sodium 141, potassium 3.8, glucose 143, BUN 16, creatinine 0.7, LFTs normal, albumin 3.4, BNP 68.4, high-sensitivity troponin 5 and 11, lactic acid 2.4 with follow-up 1.1, WBC 7600 with neutrophils 84% and absolute neutrophils 6.4%, hemoglobin 16.2, platelets 214,000; respiratory panel PCR for RSV, influenza and COVID all negative, blood cultures are pending, chest x-ray as described in HPI  Assessment and Plan: Acute hypoxemic respiratory failure secondary to multilobar bilateral pneumonia and severe bronchitis; underlying COPD Continue BiPAP and wean to Ventimask then nasal cannula as tolerated Pulmonary consultation pending at patient request Budesonide nebs every 12 hours and duo nebs every 4 hours as needed Continue cefepime and vancomycin  for multilobar pneumonia No wheezing and given underlying bacterial infection will avoid systemic steroids Early mobilization  Hypertension Based on medication reconciliation was not taking medication  Dyslipidemia Hold home meds while n.p.o. on BiPAP  History of gout Continue colchicine Currently no acute exacerbation noted   Advance Care Planning:   Code Status: Full Code   Consults: Pulmonary medicine  VTE prophylaxis: Lovenox  Family Communication: Wife and son at bedside  Severity of Illness: The appropriate patient status for this patient is OBSERVATION. Observation status is judged to be reasonable and necessary in order to provide the required intensity of service to ensure the patient's safety. The patient's presenting symptoms, physical exam findings, and initial radiographic and laboratory data in the context of their medical condition is felt to place them at decreased risk for further clinical deterioration. Furthermore, it is anticipated that the patient will be medically stable for discharge from the hospital within 2 midnights of admission.   Author: Junious Silk, NP 02/09/2022 8:57 AM  For on call review [email or web address].   **Attending Attestation Patient was seen independently of the nurse practitioner Junious Silk and I am in current agreement with assessment  and plan and additional changes to plan of care been made accordingly.  The patient presented with respiratory distress and had to be placed on noninvasive positive pressure ventilation with BiPAP and has now since been weaned off.  Pulmonary has now been consulted for further evaluation recommendations.  Will continue breathing treatments and monitor respiratory status carefully.  Patient will need PT and OT to further evaluate and treat.   Currently the patient remains on steroids and has been transition to oral prednisone for 02/11/2019 and received a dose of IV dexamethasone.  Currently he is being  admitted and treated for the following but not limited to:   Acute Respiratory Failure with Hypoxia in the setting of Severe Bronchitis with Multilobar B/L BronchoPNA and COPD -Patient presents with SOB and a Room Air Saturation <90% (60%) -SpO2: 98 % O2 Flow Rate (L/min): 5 L/min FiO2 (%): 35 % -Continuous Pulse Oximetry and Maintain O2 Saturations >90% -Continue Supplemental O2 via  and Wean O2 as Tolerated -Will need an Ambulatory Home O2 Screen prior to D/C and repeat CXR in the AM  -PT/OT to Evaluate and Treat   Lactic Acidosis -Patient's LA went from 2.4 -> 1.1 -Continue to Monitor and Trend -Repeat in the AM    Acute Exacerbation of COPD  -See as above -Add Flutter Valve, Incentive Spirometry, Guaifenesin  -C/w Abx and Steroids -Wean O2 as tolerated   Severe Bronchitis with Multilobar B/L BronchoPNA  -C/w Abx and Treatment as above -WBC went from 7.6 -> 13.5 -Obtain Sputum Sample  -Repeat CXR in the AM    Rest Per Plan   Marguerita Merles, DO [facility]

## 2022-02-09 NOTE — Consult Note (Signed)
PULMONOLOGY         Date: 02/09/2022,   MRN# [id number] Christian Shelton 1950-08-14     AdmissionWeight: 68.5 kg                 CurrentWeight: 68.5 kg  Referring provider: Dr Laurell Josephs   CHIEF COMPLAINT:   Acute on chronic hypoxemic respiratory failure    HISTORY OF PRESENT ILLNESS   This is a pleasant 71 year old male with a history of advanced COPD. He does use inhalers at home for COPD and has been relatively stable over the last few years.  I am well familiar with this patient and take care of the COPD on outpatient basis.  He generally is fully compliant with his regimen and is not a very sick individual.  Reports that this a.m. he woke up with chills and low-grade fever, his wife accompanies him in the ER and helps with details of interview and presentation.  Reports chest discomfort in the a.m. upon awakening with difficulty breathing requiring extra oxygen.  He notes cough with expectoration of inspissated volume in his phlegm.  EMS was called due to respiratory distress with findings of hypoxemia on arrival with O2 saturations being in the low 60s.  Did receive nebulizer therapy and CPAP in the field.  On arrival to the ER he did require BiPAP was found to have lactic acidosis, his x-ray was done which revealed mild right lower lobe infiltration with bronchitic changes however no consolidated infiltrate.  He had a venous blood gas with notable hypoxemia.  Currently is being treated with steroids as well as empiric antibiotics with cefepime and vancomycin.  Reviewed findings with patient and family as well as medical plan and expect likely 2-day admission.   PAST MEDICAL HISTORY   Past Medical History:  Diagnosis Date   Benign hypertension    COPD (chronic obstructive pulmonary disease) ([place])    Environmental allergies    Hyperlipemia    Osteoarthritis      SURGICAL HISTORY   Past Surgical History:  Procedure Laterality Date   VASECTOMY       FAMILY HISTORY    History reviewed. No pertinent family history.   SOCIAL HISTORY   Social History   Tobacco Use   Smoking status: Former   Smokeless tobacco: Never  Vaping Use   Vaping Use: Never used  Substance Use Topics   Alcohol use: Yes   Drug use: Never     MEDICATIONS    Home Medication:  Current Outpatient Rx   Order #: [id number] Class: Historical Med   Order #: [id number] Class: Historical Med   Order #: [id number] Class: Normal   Order #: [id number] Class: Historical Med   Order #: [id number] Class: Historical Med   Order #: [id number] Class: Historical Med   Order #: [id number] Class: Normal   Order #: [id number] Class: Historical Med   Order #: [id number] Class: Historical Med   Order #: [id number] Class: Historical Med   Order #: [id number] Class: Historical Med   Order #: [id number] Class: Normal   Order #: [id number] Class: Historical Med    Current Medication:  Current Facility-Administered Medications:    acetaminophen (TYLENOL) tablet 650 mg, 650 mg, Oral, Q6H PRN **OR** acetaminophen (TYLENOL) suppository 650 mg, 650 mg, Rectal, Q6H PRN, Samella Parr, NP   atorvastatin (LIPITOR) tablet 20 mg, 20 mg, Oral, Daily, Samella Parr, NP   budesonide (PULMICORT) nebulizer solution 0.25 mg, 0.25 mg, Nebulization, BID, Erin Hearing L, NP, 0.25 mg at 02/09/22 1307   ceFEPIme (MAXIPIME)  2 g in sodium chloride 0.9 % 100 mL IVPB, 2 g, Intravenous, Q8H, Lorin Picket, RPH   colchicine tablet 0.6 mg, 0.6 mg, Oral, Daily, Samella Parr, NP   enoxaparin (LOVENOX) injection 40 mg, 40 mg, Subcutaneous, Q24H, Samella Parr, NP   ipratropium-albuterol (DUONEB) 0.5-2.5 (3) MG/3ML nebulizer solution 3 mL, 3 mL, Nebulization, Q4H PRN, Samella Parr, NP   ondansetron (ZOFRAN) tablet 4 mg, 4 mg, Oral, Q6H PRN **OR** ondansetron (ZOFRAN) injection 4 mg, 4 mg, Intravenous, Q6H PRN, Samella Parr, NP   vancomycin (VANCOREADY) IVPB 750 mg/150 mL, 750 mg, Intravenous, Q12H, Lorin Picket, [facility]  Current Outpatient Medications:    albuterol (VENTOLIN HFA) 108 (90 Base) MCG/ACT inhaler, Inhale into the lungs., Disp: , Rfl:    atorvastatin (LIPITOR) 20 MG tablet, Take 1 tablet by mouth daily., Disp: , Rfl:    benzonatate (TESSALON) 100 MG capsule, Take 1 capsule (100 mg total) by mouth every 8 (eight) hours., Disp: 21 capsule, Rfl: 0   budesonide (PULMICORT) 0.5 MG/2ML nebulizer solution, Take by nebulization., Disp: , Rfl:    budesonide-formoterol (SYMBICORT) 160-4.5 MCG/ACT inhaler, INHALE 2 INHALATIONS BY  MOUTH INTO THE LUNGS TWICE  DAILY, Disp: , Rfl:    colchicine 0.6 MG tablet, Take 0.6 mg by mouth daily., Disp: , Rfl:    doxycycline (VIBRAMYCIN) 100 MG capsule, Take 1 capsule (100 mg total) by mouth 2 (two) times daily., Disp: 20 capsule, Rfl: 0   ipratropium-albuterol (DUONEB) 0.5-2.5 (3) MG/3ML SOLN, USE 1 VIAL VIA NEBULIZER 4  TIMES DAILY, Disp: , Rfl:    montelukast (SINGULAIR) 10 MG tablet, Take by mouth., Disp: , Rfl:    naproxen (NAPROSYN) 500 MG tablet, Take 1 tablet by mouth 2 (two) times daily with a meal., Disp: , Rfl:    omeprazole (PRILOSEC) 40 MG capsule, Take by mouth., Disp: , Rfl:    predniSONE (DELTASONE) 10 MG tablet, Take 4 tablets by mouth with breakfast for 2 days, 2 tablets by mouth for 2 days and 1 tablet by mouth for 2 days., Disp: 14 tablet, Rfl: 0   sildenafil (REVATIO) 20 MG tablet, 3-5 tablets daily as needed for ED, Disp: , Rfl:     ALLERGIES   Celebrex [celecoxib] and Solu-medrol [methylprednisolone]     REVIEW OF SYSTEMS    Review of Systems:  Gen:  Denies  fever, sweats, chills weigh loss  HEENT: Denies blurred vision, double vision, ear pain, eye pain, hearing loss, nose bleeds, sore throat Cardiac:  No dizziness, chest pain or heaviness, chest tightness,edema Resp:   reports dyspnea chronically  Gi: Denies swallowing difficulty, stomach pain, nausea or vomiting, diarrhea, constipation, bowel incontinence Gu:  Denies bladder  incontinence, burning urine Ext:   Denies Joint pain, stiffness or swelling Skin: Denies  skin rash, easy bruising or bleeding or hives Endoc:  Denies polyuria, polydipsia , polyphagia or weight change Psych:   Denies depression, insomnia or hallucinations   Other:  All other systems negative   VS: BP 122/82   Pulse 75   Temp 97.8 F (36.6 C) (Axillary)   Resp 12   Ht _0  (1.676 m)   Wt 68.5 kg   SpO2 96%   BMI 24.37 kg/m      PHYSICAL EXAM    GENERAL:NAD, no fevers, chills, no weakness no fatigue HEAD: Normocephalic, atraumatic.  EYES: Pupils equal, round, reactive to light. Extraocular muscles intact. No scleral icterus.  MOUTH: Moist mucosal membrane. Dentition intact. No  abscess noted.  EAR, NOSE, THROAT: Clear without exudates. No external lesions.  NECK: Supple. No thyromegaly. No nodules. No JVD.  PULMONARY: decreased breath sounds with mild rhonchi worse at bases bilaterally.  CARDIOVASCULAR: S1 and S2. Regular rate and rhythm. No murmurs, rubs, or gallops. No edema. Pedal pulses 2+ bilaterally.  GASTROINTESTINAL: Soft, nontender, nondistended. No masses. Positive bowel sounds. No hepatosplenomegaly.  MUSCULOSKELETAL: No swelling, clubbing, or edema. Range of motion full in all extremities.  NEUROLOGIC: Cranial nerves II through XII are intact. No gross focal neurological deficits. Sensation intact. Reflexes intact.  SKIN: No ulceration, lesions, rashes, or cyanosis. Skin warm and dry. Turgor intact.  PSYCHIATRIC: Mood, affect within normal limits. The patient is awake, alert and oriented x 3. Insight, judgment intact.       IMAGING     ASSESSMENT/PLAN   Acute hypoxemic respiratory failure - present on admission  - COVID19 negative, Flu negative RSV negative   - supplemental O2 during my evaluation 5L/min [place] -Respiratory viral panel -legionella ab -strep pneumoniae ur AG -reviewed pertinent imaging with patient today -PT/OT for d/c planning  -please  encourage patient to use incentive spirometer few times each hour while hospitalized.     COPD    -Continue generic COPD carepath   -he's on dexamethasone, and antibiotics.    - we can dc vanco if MRSA pcr is negative  - ill trend procal for additional antimicrobial guidance  - crp and esr for inflammatory data to help with steroids and clinical spectrum       Thank you for allowing me to participate in the care of this patient.   Patient/Family are satisfied with care plan and all questions have been answered.    Provider disclosure: Patient with at least one acute or chronic illness or injury that poses a threat to life or bodily function and is being managed actively during this encounter.  All of the below services have been performed independently by signing provider:  review of prior documentation from internal and or external health records.  Review of previous and current lab results.  Interview and comprehensive assessment during patient visit today. Review of current and previous chest radiographs/CT scans. Discussion of management and test interpretation with health care team and patient/family.   This document was prepared using Dragon voice recognition software and may include unintentional dictation errors.     Ottie Glazier, M.D.  Division of Pulmonary & Critical Care Medicine

## 2022-02-09 NOTE — ED Triage Notes (Signed)
EMS called for respiratory distress.  Initial RA sat 60%, speaking in broken sentences.  Pt reports coughing green phlegm x3 days.  Pt on CPAP on arrival.  3 Duonebs, 1 albuterol.

## 2022-02-09 NOTE — ED Provider Notes (Signed)
[facility] Provider Note    Event Date/Time   First MD Initiated Contact with Patient 02/09/22 0315     (approximate)   History   Respiratory Distress   HPI  Christian Shelton is a 71 y.o. male who presents for evaluation of severe respiratory distress.  He has a history of COPD but does not use supplemental oxygen.  He said that he has been gradually getting worse over the last 3 days with coughing up green phlegm.  No body aches, sore throat, nausea, vomiting, nor chest pain.  He said that even now he is not having chest pain, he just feels like he cannot breathe.  When EMS arrived, his initial SpO2 was 60%.  He was brought in after 3 DuoNebs and 1 albuterol and his work of breathing improved and his oxygen saturation came up to the 90s once he was placed on CPAP.  They did not give him Solu-Medrol because he told them he was allergic to it.  The patient is awake and alert and is speaking a few words at a time.  He denies pain, just feels short of breath.     Physical Exam   Triage Vital Signs: ED Triage Vitals  Enc Vitals Group     BP 02/09/22 0315 113/77     Pulse Rate 02/09/22 0315 (!) 125     Resp 02/09/22 0315 20     Temp 02/09/22 0315 100.1 F (37.8 C)     Temp Source 02/09/22 0315 Axillary     SpO2 02/09/22 0315 95 %     Weight 02/09/22 0318 68.5 kg (151 lb)     Height 02/09/22 0318 1.676 m (5\' 6" )     Head Circumference --      Peak Flow --      Pain Score 02/09/22 0318 0     Pain Loc --      Pain Edu? --      Excl. in GC? --     Most recent vital signs: Vitals:   02/09/22 0630 02/09/22 0730  BP: 123/81 113/74  Pulse: 87 94  Resp: (!) 24 17  Temp:    SpO2: 94% 94%     General: Awake, no distress.  Appears chronically ill at baseline. CV:  Good peripheral perfusion.  Tachycardia in the 120s, regular rhythm. Resp:  Increased work of breathing with intercostal retractions and accessory muscle usage.  Some wheezing and crackles  throughout lung fields but decreased air movement throughout. Abd:  No distention.  No to nurse to palpation of the abdomen. Other:  Moving all 4 extremities, awake and alert and appears to have decision-making capacity.   ED Results / Procedures / Treatments   Labs (all labs ordered are listed, but only abnormal results are displayed) Labs Reviewed  COMPREHENSIVE METABOLIC PANEL - Abnormal; Notable for the following components:      Result Value   Chloride 112 (*)    Glucose, Bld 143 (*)    Calcium 8.3 (*)    Total Protein 6.0 (*)    Albumin 3.4 (*)    All other components within normal limits  CBC WITH DIFFERENTIAL/PLATELET - Abnormal; Notable for the following components:   Lymphs Abs 0.6 (*)    All other components within normal limits  BLOOD GAS, VENOUS - Abnormal; Notable for the following components:   pO2, Ven 46 (*)    Bicarbonate 29.0 (*)    Acid-Base Excess 3.0 (*)  All other components within normal limits  LACTIC ACID, PLASMA - Abnormal; Notable for the following components:   Lactic Acid, Venous 2.4 (*)    All other components within normal limits  RESP PANEL BY RT-PCR (RSV, FLU A&B, COVID)  RVPGX2  CULTURE, BLOOD (SINGLE)  CULTURE, BLOOD (SINGLE)  BRAIN NATRIURETIC PEPTIDE  LIPASE, BLOOD  URINALYSIS, ROUTINE W REFLEX MICROSCOPIC  LACTIC ACID, PLASMA  TROPONIN I (HIGH SENSITIVITY)  TROPONIN I (HIGH SENSITIVITY)     EKG  ED ECG REPORT I, Loleta Rose, the attending physician, personally viewed and interpreted this ECG.  Date: 02/09/2022 EKG Time: 3:23 AM  Rate: 121 Rhythm: Sinus tachycardia QRS Axis: normal Intervals: normal ST/T Wave abnormalities: Non-specific ST segment / T-wave changes, but no clear evidence of acute ischemia. Narrative Interpretation: no definitive evidence of acute ischemia; does not meet STEMI criteria.    RADIOLOGY I viewed and interpreted the patient's 1 view chest x-ray.  In addition to COPD, he appears to have some  developing opacities on the right.  Radiologist expresses concern that this could represent bronchopneumonia.    PROCEDURES:  Critical Care performed: Yes, see critical care procedure note(s)  .1-3 Lead EKG Interpretation  Performed by: Loleta Rose, MD Authorized by: Loleta Rose, MD     Interpretation: normal     ECG rate:  90   ECG rate assessment: normal     Rhythm: sinus rhythm     Ectopy: none     Conduction: normal   .Critical Care  Performed by: Loleta Rose, MD Authorized by: Loleta Rose, MD   Critical care provider statement:    Critical care time (minutes):  30   Critical care time was exclusive of:  Separately billable procedures and treating other patients   Critical care was necessary to treat or prevent imminent or life-threatening deterioration of the following conditions:  Respiratory failure   Critical care was time spent personally by me on the following activities:  Development of treatment plan with patient or surrogate, evaluation of patient's response to treatment, examination of patient, obtaining history from patient or surrogate, ordering and performing treatments and interventions, ordering and review of laboratory studies, ordering and review of radiographic studies, pulse oximetry, re-evaluation of patient's condition and review of old charts    MEDICATIONS ORDERED IN ED: Medications  vancomycin (VANCOREADY) IVPB 1500 mg/300 mL (1,500 mg Intravenous New Bag/Given 02/09/22 0743)  lactated ringers bolus 1,000 mL (0 mLs Intravenous Stopped 02/09/22 0701)  dexamethasone (DECADRON) injection 10 mg (10 mg Intravenous Given 02/09/22 0522)  ceFEPIme (MAXIPIME) 1 g in sodium chloride 0.9 % 100 mL IVPB (0 g Intravenous Stopped 02/09/22 0754)     IMPRESSION / MDM / ASSESSMENT AND PLAN / ED COURSE  I reviewed the triage vital signs and the nursing notes.                              Differential diagnosis includes, but is not limited to, COPD  exacerbation, new onset CHF, pneumonia, viral infection, electrolyte or metabolic abnormality.  Patient's presentation is most consistent with acute presentation with potential threat to life or bodily function.  Lab/studies ordered: EKG, 1 view chest x-ray, CBC with differential, CMP, lactic acid, BNP, lipase, high-sensitivity troponin x 2, respiratory viral panel, VBG, urinalysis ("possible sepsis" workup).  The patient is on the cardiac monitor to evaluate for evidence of arrhythmia and/or significant heart rate changes.  I ordered another DuoNeb and Decadron  10 mg IV; the patient believes he is allergic to Solu-Medrol, but the allergy is syncope and this is almost entirely unrelated to the medication.  He would benefit from steroids so I thought Decadron would be a reasonable alternative.  I ordered BiPAP as soon as I saw the patient and he is tolerating it well.    Clinical Course as of 02/09/22 1601  Fri Feb 09, 2022  0502 Lactic Acid, Venous(!!): 2.4 The patient is noted to have a lactate>4. With the current information available to me, I don't think the patient is in septic shock. The lactate>2, is related to respiratory distress/ respiratory failure  [CF]  0516 Blood gas, venous(!) VBG generally reassuring, no hypercapnia [CF]  0542 Patient has a negative respiratory viral panel and a normal white blood cell count.  However, as documented in the radiology section above, his chest x-ray is concerning for severe bronchitis with bronchopneumonia.  This is consistent with a slight fever.  Initially he was tachycardic but that has resolved.  However at a minimum he has pneumonia with a COPD exacerbation and a new oxygen requirement (acute respiratory failure with hypoxia).  I updated him and his wife and explained the need for admission for additional treatment.  I ordered broad-spectrum antibiotics given his COPD and the severity of the bronchopneumonia (cefepime 1 g IV and vancomycin per  pharmacy protocol).  [CF]  0932 I consulted the hospitalist for admission. [CF]  3557 Consulting Dr. Arville Care with the hospitalist service.  He is putting in admission orders. [CF]    Clinical Course User Index [CF] Loleta Rose, MD     FINAL CLINICAL IMPRESSION(S) / ED DIAGNOSES   Final diagnoses:  Acute respiratory failure with hypoxia (HCC)  COPD exacerbation (HCC)     Rx / DC Orders   ED Discharge Orders     None        Note:  This document was prepared using Dragon voice recognition software and may include unintentional dictation errors.   Loleta Rose, MD 02/09/22 [id number]

## 2022-02-09 NOTE — ED Notes (Signed)
Called RT to wean patient off bipap - pt resting easy in stretcher. RR even and unlabored. Pt in no distress.

## 2022-02-09 NOTE — ED Notes (Signed)
Pt requesting to eat - OK TO EAT PER Junious Silk NP

## 2022-02-09 NOTE — Progress Notes (Signed)
Pt taken off bipap and placed on 5lpm [place] sats 96%, respiratory rate 16/min, HR 84/min. Will continue to monitor.

## 2022-02-09 NOTE — Consult Note (Signed)
Pharmacy Antibiotic Note  Christian Shelton is a 71 y.o. male admitted on 02/09/2022 with pneumonia.  Pharmacy has been consulted for cefepime and vancomycin dosing.  Vancomycin 1750 mg given 12/29 @ 0743 Cefepime 1 gram given 12/29 @ 0704  Plan: Start Cefepime 2 grams IV every 8 hours Start vancomycin 750 mg IV every 12 hours Estimated AUC 442.4, Cmin 12.7 Vancomycin levels as clinically appropriate MRSA screen ordered  Height: 5\' 6"  (167.6 cm) Weight: 68.5 kg (151 lb) IBW/kg (Calculated) : 63.8  Temp (24hrs), Avg:99 F (37.2 C), Min:97.8 F (36.6 C), Max:100.1 F (37.8 C)  Recent Labs  Lab 02/09/22 0329 02/09/22 0747  WBC 7.6  --   CREATININE 0.70  --   LATICACIDVEN 2.4* 1.1    Estimated Creatinine Clearance: 77.5 mL/min (by C-G formula based on SCr of 0.7 mg/dL).    Allergies  Allergen Reactions   Celebrex [Celecoxib] Nausea Only   Solu-Medrol [Methylprednisolone] Other (See Comments)    syncope    Antimicrobials this admission: Cefepime 12/29 >>  vancomycin 12/29 >>   Dose adjustments this admission: N/A  Microbiology results: 12/29 BCx: pending 12/29 MRSA PCR: pending  Thank you for allowing pharmacy to be a part of this patient's care.  1/30, PharmD 02/09/2022 11:53 AM

## 2022-02-09 NOTE — Progress Notes (Signed)
PHARMACY -  BRIEF ANTIBIOTIC NOTE   Pharmacy has received consult(s) for Vancomycin from an ED provider.  The patient's profile has been reviewed for ht/wt/allergies/indication/available labs.    One time order(s) placed for Vancomycin 1500 mg per pt wt: 68.5 kg.  Further antibiotics/pharmacy consults should be ordered by admitting physician if indicated.                       Thank you, Otelia Sergeant, PharmD, [facility] 02/09/2022 5:29 AM

## 2022-02-10 ENCOUNTER — Inpatient Hospital Stay: Payer: Managed Care, Other (non HMO)

## 2022-02-10 DIAGNOSIS — J180 Bronchopneumonia, unspecified organism: Secondary | ICD-10-CM | POA: Diagnosis not present

## 2022-02-10 DIAGNOSIS — J9601 Acute respiratory failure with hypoxia: Secondary | ICD-10-CM | POA: Diagnosis not present

## 2022-02-10 DIAGNOSIS — J9600 Acute respiratory failure, unspecified whether with hypoxia or hypercapnia: Secondary | ICD-10-CM | POA: Diagnosis present

## 2022-02-10 DIAGNOSIS — D72829 Elevated white blood cell count, unspecified: Secondary | ICD-10-CM | POA: Diagnosis not present

## 2022-02-10 DIAGNOSIS — J441 Chronic obstructive pulmonary disease with (acute) exacerbation: Secondary | ICD-10-CM | POA: Diagnosis not present

## 2022-02-10 LAB — CBC WITH DIFFERENTIAL/PLATELET
Abs Immature Granulocytes: 0.03 10*3/uL (ref 0.00–0.07)
Basophils Absolute: 0 10*3/uL (ref 0.0–0.1)
Basophils Relative: 0 %
Eosinophils Absolute: 0 10*3/uL (ref 0.0–0.5)
Eosinophils Relative: 0 %
HCT: 41.6 % (ref 39.0–52.0)
Hemoglobin: 14 g/dL (ref 13.0–17.0)
Immature Granulocytes: 0 %
Lymphocytes Relative: 9 %
Lymphs Abs: 0.7 10*3/uL (ref 0.7–4.0)
MCH: 33.1 pg (ref 26.0–34.0)
MCHC: 33.7 g/dL (ref 30.0–36.0)
MCV: 98.3 fL (ref 80.0–100.0)
Monocytes Absolute: 0.6 10*3/uL (ref 0.1–1.0)
Monocytes Relative: 7 %
Neutro Abs: 6.4 10*3/uL (ref 1.7–7.7)
Neutrophils Relative %: 84 %
Platelets: 187 10*3/uL (ref 150–400)
RBC: 4.23 MIL/uL (ref 4.22–5.81)
RDW: 13.1 % (ref 11.5–15.5)
WBC: 7.7 10*3/uL (ref 4.0–10.5)
nRBC: 0 % (ref 0.0–0.2)

## 2022-02-10 LAB — CBG MONITORING, ED: Glucose-Capillary: 169 mg/dL — ABNORMAL HIGH (ref 70–99)

## 2022-02-10 LAB — COMPREHENSIVE METABOLIC PANEL
ALT: 20 U/L (ref 0–44)
AST: 16 U/L (ref 15–41)
Albumin: 3 g/dL — ABNORMAL LOW (ref 3.5–5.0)
Alkaline Phosphatase: 47 U/L (ref 38–126)
Anion gap: 5 (ref 5–15)
BUN: 17 mg/dL (ref 8–23)
CO2: 25 mmol/L (ref 22–32)
Calcium: 8 mg/dL — ABNORMAL LOW (ref 8.9–10.3)
Chloride: 110 mmol/L (ref 98–111)
Creatinine, Ser: 0.71 mg/dL (ref 0.61–1.24)
GFR, Estimated: >60 mL/min (ref >60)
Glucose, Bld: 112 mg/dL — ABNORMAL HIGH (ref 70–99)
Potassium: 3.9 mmol/L (ref 3.5–5.1)
Sodium: 140 mmol/L (ref 135–145)
Total Bilirubin: 0.6 mg/dL (ref 0.3–1.2)
Total Protein: 5.5 g/dL — ABNORMAL LOW (ref 6.5–8.1)

## 2022-02-10 LAB — MAGNESIUM: Magnesium: 2.3 mg/dL (ref 1.7–2.4)

## 2022-02-10 LAB — PHOSPHORUS: Phosphorus: 2 mg/dL — ABNORMAL LOW (ref 2.5–4.6)

## 2022-02-10 LAB — SURGICAL PCR SCREEN
MRSA, PCR: NEGATIVE
Staphylococcus aureus: POSITIVE — AB

## 2022-02-10 MED ORDER — IPRATROPIUM-ALBUTEROL 0.5-2.5 (3) MG/3ML IN SOLN
3.0000 mL | Freq: Three times a day (TID) | RESPIRATORY_TRACT | Status: DC
  Administered 2022-02-10 (×3): 3 mL via RESPIRATORY_TRACT
  Filled 2022-02-10 (×3): qty 3

## 2022-02-10 MED ORDER — K PHOS MONO-SOD PHOS DI & MONO 155-852-130 MG PO TABS
500.0000 mg | ORAL_TABLET | Freq: Two times a day (BID) | ORAL | Status: AC
  Administered 2022-02-10 (×2): 500 mg via ORAL
  Filled 2022-02-10 (×2): qty 2

## 2022-02-10 MED ORDER — DOXYCYCLINE HYCLATE 100 MG PO TABS
100.0000 mg | ORAL_TABLET | Freq: Two times a day (BID) | ORAL | Status: DC
  Administered 2022-02-10 – 2022-02-11 (×3): 100 mg via ORAL
  Filled 2022-02-10 (×3): qty 1

## 2022-02-10 NOTE — Progress Notes (Signed)
PULMONOLOGY         Date: 02/10/2022,   MRN# [id number] Christian Shelton [birth year]     AdmissionWeight: 68.5 kg                 CurrentWeight: 68.5 kg  Referring provider: Dr Laurell Josephs   CHIEF COMPLAINT:   Acute on chronic hypoxemic respiratory failure    HISTORY OF PRESENT ILLNESS   This is a pleasant 71 year old male with a history of advanced COPD. He does use inhalers at home for COPD and has been relatively stable over the last few years.  I am well familiar with this patient and take care of the COPD on outpatient basis.  He generally is fully compliant with his regimen and is not a very sick individual.  Reports that this a.m. he woke up with chills and low-grade fever, his wife accompanies him in the ER and helps with details of interview and presentation.  Reports chest discomfort in the a.m. upon awakening with difficulty breathing requiring extra oxygen.  He notes cough with expectoration of inspissated volume in his phlegm.  EMS was called due to respiratory distress with findings of hypoxemia on arrival with O2 saturations being in the low 60s.  Did receive nebulizer therapy and CPAP in the field.  On arrival to the ER he did require BiPAP was found to have lactic acidosis, his x-ray was done which revealed mild right lower lobe infiltration with bronchitic changes however no consolidated infiltrate.  He had a venous blood gas with notable hypoxemia.  Currently is being treated with steroids as well as empiric antibiotics with cefepime and vancomycin.  Reviewed findings with patient and family as well as medical plan and expect likely 2-day admission.  02/10/22- patient has improved overnight. His O2 saturation is better with reduced O2 req.  He does have +MRSA nasal swab, I have narrowed antibiotics to doxycycline PO bid 161m only.  CBC with resolution of leukocytosis, steroids reduced to 548mof prednisone.  Inflammatory biomarkers elevated consistent with clinical  presentation of Acute COPD exacerbation.  20 species panel RVP and nasal PCR for COVID/Flu/RSV are both negative. CMP with mild hypoalbuminemia reflecting reduced appetite and protein. Plan for up in chair today with PT to walk around hall. Incentive spirometry and flutter valve q1h.  Dcd xopenex, dcd atrovent neb, changed duoneb to scheduled tid while inpatient. Recommendation for DC planning from pulmonary perspective.     PAST MEDICAL HISTORY   Past Medical History:  Diagnosis Date   Benign hypertension    COPD [place] obstructive [place] disease) [place]   Environmental allergies    Hyperlipemia    Osteoarthritis      SURGICAL HISTORY   Past Surgical History:  Procedure Laterality Date   VASECTOMY       FAMILY HISTORY   History reviewed. No pertinent family history.   SOCIAL HISTORY   Social History   Tobacco Use   Smoking status: Former   Smokeless tobacco: Never  VaScientific laboratory technicianse: Never used  Substance Use Topics   Alcohol use: Yes   Drug use: Never     MEDICATIONS    Home Medication:  Current Outpatient Rx   Order #: [id number]: Historical Med   Order #: [id number]: Historical Med   Order #: [id number]: Historical Med   Order #: [id number]: Historical Med   Order #: [id number]: Historical Med   Order #: [id number]: Historical Med   Order #: [id number]:  Historical Med   Order #: [id number] Class: Historical Med   Order #: [id number] Class: Historical Med   Order #: [id number] Class: Historical Med    Current Medication:  Current Facility-Administered Medications:    acetaminophen (TYLENOL) tablet 650 mg, 650 mg, Oral, Q6H PRN **OR** acetaminophen (TYLENOL) suppository 650 mg, 650 mg, Rectal, Q6H PRN, Samella Parr, NP   atorvastatin (LIPITOR) tablet 20 mg, 20 mg, Oral, QHS, Samella Parr, NP, 20 mg at 02/09/22 2201   budesonide (PULMICORT) nebulizer solution 0.25 mg, 0.25 mg, Nebulization, BID, Samella Parr, NP, 0.25 mg at 02/09/22 2101   doxycycline (VIBRA-TABS) tablet 100 mg, 100 mg, Oral, Q12H, Chalonda Schlatter, MD   enoxaparin (LOVENOX) injection 40 mg, 40 mg, Subcutaneous, Q24H, Samella Parr, NP, 40 mg at 02/09/22 2201   guaiFENesin (MUCINEX) 12 hr tablet 1,200 mg, 1,200 mg, Oral, BID, Sheikh, Omair Latif, DO, 1,200 mg at 02/09/22 2201   ipratropium-albuterol (DUONEB) 0.5-2.5 (3) MG/3ML nebulizer solution 3 mL, 3 mL, Nebulization, TID, Kayd Launer, MD   ondansetron (ZOFRAN) tablet 4 mg, 4 mg, Oral, Q6H PRN **OR** ondansetron (ZOFRAN) injection 4 mg, 4 mg, Intravenous, Q6H PRN, Samella Parr, NP   predniSONE (DELTASONE) tablet 50 mg, 50 mg, Oral, Q breakfast, Ottie Glazier, MD  Current Outpatient Medications:    albuterol (VENTOLIN HFA) 108 (90 Base) MCG/ACT inhaler, Inhale 2 puffs into the lungs every 4 (four) hours as needed for wheezing or shortness of breath., Disp: , Rfl:    atorvastatin (LIPITOR) 20 MG tablet, Take 20 mg by mouth daily., Disp: , Rfl:    budesonide (PULMICORT) 0.5 MG/2ML nebulizer solution, Take 0.5 mg by nebulization daily., Disp: , Rfl:    budesonide-formoterol (SYMBICORT) 160-4.5 MCG/ACT inhaler, Inhale 2 puffs into the lungs 2 (two) times daily., Disp: , Rfl:    Dextromethorphan-guaiFENesin (CORICIDIN HBP CONGESTION/COUGH) 10-200 MG CAPS, Take 1-2 capsules by mouth as directed., Disp: , Rfl:    furosemide (LASIX) 20 MG tablet, Take 20 mg by mouth daily as needed for fluid or edema., Disp: , Rfl:    montelukast (SINGULAIR) 10 MG tablet, Take 10 mg by mouth every evening., Disp: , Rfl:    omeprazole (PRILOSEC) 40 MG capsule, Take 40 mg by mouth daily., Disp: , Rfl:    rOPINIRole (REQUIP XL) 2 MG 24 hr tablet, Take 2 mg by mouth at bedtime., Disp: , Rfl:    sildenafil (REVATIO) 20 MG tablet, Take 60-100 mg by mouth daily as needed (ED symptoms)., Disp: , Rfl:     ALLERGIES   Celebrex [celecoxib] and Solu-medrol [methylprednisolone]     REVIEW OF  SYSTEMS    Review of Systems:  Gen:  Denies  fever, sweats, chills weigh loss  HEENT: Denies blurred vision, double vision, ear pain, eye pain, hearing loss, nose bleeds, sore throat Cardiac:  No dizziness, chest pain or heaviness, chest tightness,edema Resp:   reports dyspnea chronically  Gi: Denies swallowing difficulty, stomach pain, nausea or vomiting, diarrhea, constipation, bowel incontinence Gu:  Denies bladder incontinence, burning urine Ext:   Denies Joint pain, stiffness or swelling Skin: Denies  skin rash, easy bruising or bleeding or hives Endoc:  Denies polyuria, polydipsia , polyphagia or weight change Psych:   Denies depression, insomnia or hallucinations   Other:  All other systems negative   VS: BP 132/79 (BP Location: Left Arm)   Pulse 71   Temp 98 F (36.7 C) (Oral)   Resp 15   Ht _0  (1.676 m)  Wt 68.5 kg   SpO2 100%   BMI 24.37 kg/m      PHYSICAL EXAM    GENERAL:NAD, no fevers, chills, no weakness no fatigue HEAD: Normocephalic, atraumatic.  EYES: Pupils equal, round, reactive to light. Extraocular muscles intact. No scleral icterus.  MOUTH: Moist mucosal membrane. Dentition intact. No abscess noted.  EAR, NOSE, THROAT: Clear without exudates. No external lesions.  NECK: Supple. No thyromegaly. No nodules. No JVD.  PULMONARY: decreased breath sounds with mild rhonchi worse at bases bilaterally.  CARDIOVASCULAR: S1 and S2. Regular rate and rhythm. No murmurs, rubs, or gallops. No edema. Pedal pulses 2+ bilaterally.  GASTROINTESTINAL: Soft, nontender, nondistended. No masses. Positive bowel sounds. No hepatosplenomegaly.  MUSCULOSKELETAL: No swelling, clubbing, or edema. Range of motion full in all extremities.  NEUROLOGIC: Cranial nerves II through XII are intact. No gross focal neurological deficits. Sensation intact. Reflexes intact.  SKIN: No ulceration, lesions, rashes, or cyanosis. Skin warm and dry. Turgor intact.  PSYCHIATRIC: Mood, affect  within normal limits. The patient is awake, alert and oriented x 3. Insight, judgment intact.       IMAGING     ASSESSMENT/PLAN   Acute hypoxemic respiratory failure - present on admission  - COVID19 negative, Flu negative RSV negative   - supplemental O2 during my evaluation 5L/min [place] -Respiratory viral panel negative  -reviewed pertinent imaging with patient today -PT/OT for d/c planning  -reduced abx to doxy and steroids to prednisone -recommendation for IS and flutter valve use each hour -please encourage patient to use incentive spirometer few times each hour while hospitalized.     COPD    -Continue generic COPD carepath   -he's on dexamethasone, and antibiotics.    - we can dc vanco if MRSA pcr is negative  - ill trend procal for additional antimicrobial guidance  - crp and esr for inflammatory data to help with steroids and clinical spectrum       Thank you for allowing me to participate in the care of this patient.   Patient/Family are satisfied with care plan and all questions have been answered.    Provider disclosure: Patient with at least one acute or chronic illness or injury that poses a threat to life or bodily function and is being managed actively during this encounter.  All of the below services have been performed independently by signing provider:  review of prior documentation from internal and or external health records.  Review of previous and current lab results.  Interview and comprehensive assessment during patient visit today. Review of current and previous chest radiographs/CT scans. Discussion of management and test interpretation with health care team and patient/family.   This document was prepared using Dragon voice recognition software and may include unintentional dictation errors.     Ottie Glazier, M.D.  Division of Pulmonary & Critical Care Medicine

## 2022-02-10 NOTE — Progress Notes (Signed)
PROGRESS NOTE    Christian Shelton  LKG:[id number] DOB: 03-02-1950 DOA: 02/09/2022 PCP: Marguarite Arbour, MD   Brief Narrative:  The patient is a 71 year old Caucasian male with past medical history significant for but not to hypertension, COPD not on home oxygen, dyslipidemia, osteoarthritis as well as other comorbidities who reports 3 days of fevers and chills with nonproductive cough prior to coming in to the hospital.  He felt generalized malaise and EMS was called and upon evaluation on room air he is found to have saturations in the 60s.  He was given nebulizer and started on CPAP in the field he was noted to have significant dyspnea while speaking.  After arrival to the ED he had increased work of breathing and was transitioned to BiPAP and has initial lactic acid level was slightly elevated.  He did not have a leukocytosis on admission and his BNP was normal.  Chest x-ray was concerning for severe bronchitis with evolving multilobar bilateral bronchopneumonia worse on the right compared to left.  He was admitted for acute respiratory failure with hypoxia in the setting of severe bronchitis with multilobar bilateral bronchopneumonia and acute COPD exacerbation and initiated on steroids and antibiotics as well as supplemental oxygen.  He is also placed on nebs and pulmonary was consulted and he is improving.  He is now since been weaned off of BiPAP and desaturated on room air so had to be placed on 2 L of supplemental oxygen nasal cannula.  Assessment and Plan:  Acute Respiratory Failure with Hypoxia in the setting of Severe Bronchitis with Multilobar B/L BronchoPNA and COPD -Patient presents with SOB and a Room Air Saturation <90% (60%) -SpO2: 93 % O2 Flow Rate (L/min): 2 L/min FiO2 (%): 35 % -Continuous Pulse Oximetry and Maintain O2 Saturations >90% -Continue Supplemental O2 via Bath and Wean O2 as Tolerated -Will need an Ambulatory Home O2 Screen prior to D/C and repeat CXR in the AM  -CXR  this AM showed "Cardiac shadow is stable. Lungs are well aerated bilaterally. Emphysematous changes are seen. Slight improved aeration is noted when compared with the prior exam in the bases. No acute bony abnormality is noted." -Staphylococcus Aureus was + -PT/OT to Evaluate and Treat   Lactic Acidosis -Patient's LA went from 2.4 -> 1.1 -Continue to Monitor and Trend -Repeat in the AM    Acute Exacerbation of COPD  -See as above -Respiratory virus panel was ordered by PCCM this was negative -Add Flutter Valve, Incentive Spirometry, Guaifenesin  -C/w Abx and Steroids; his antibiotics have not been de-escalated to doxycycline and steroids have been de-escalated to prednisone -Wean O2 as tolerated   Severe Bronchitis with Multilobar B/L BronchoPNA  -C/w Abx and Treatment as above -WBC went from 7.6 -> 13.5 -> 7.7 -CRP was 7.1 and  -Obtain Sputum Sample  -Repeat CXR in the AM    Hypertension -Based on medication reconciliation was not taking medication -Continue to Monitor BP per Protocol -Last BP reading was 138/77   Dyslipidemia -Hold home meds while n.p.o. on BiPAP and will now resume   History of Gout -Continue colchicine -Currently no acute exacerbation noted   Hypophosphatemia -Patient's Phos Level was 2.0 -Replete with po K Phos 500 mg po BID -Repeat Phos Level in the AM   Hypoalbuminemia -Patient's Albumin Level went from 3.4 -> 3.0 -Continue to Monitor and Trend -Repeat CMP in the AM   DVT prophylaxis: enoxaparin (LOVENOX) injection 40 mg Start: 02/09/22 2200    Code Status:  Full Code Family Communication: Discussed with family at bedside  Disposition Plan:  Level of care: Stepdown Status is: Inpatient Remains inpatient appropriate because: Ending further clinical improvement and weaning of oxygen   Consultants:  Pulmonary  Procedures:  As delineated as above  Antimicrobials:  Anti-infectives (From admission, onward)    Start     Dose/Rate Route  Frequency Ordered Stop   02/10/22 1000  doxycycline (VIBRA-TABS) tablet 100 mg        100 mg Oral Every 12 hours 02/10/22 0811 02/14/22 0959   02/09/22 2000  vancomycin (VANCOREADY) IVPB 750 mg/150 mL  Status:  Discontinued        750 mg 150 mL/hr over 60 Minutes Intravenous Every 12 hours 02/09/22 1159 02/10/22 0811   02/09/22 1400  ceFEPIme (MAXIPIME) 2 g in sodium chloride 0.9 % 100 mL IVPB  Status:  Discontinued        2 g 200 mL/hr over 30 Minutes Intravenous Every 8 hours 02/09/22 1148 02/10/22 0811   02/09/22 0530  ceFEPIme (MAXIPIME) 1 g in sodium chloride 0.9 % 100 mL IVPB        1 g 200 mL/hr over 30 Minutes Intravenous  Once 02/09/22 0524 02/09/22 0754   02/09/22 0530  vancomycin (VANCOREADY) IVPB 1500 mg/300 mL        1,500 mg 150 mL/hr over 120 Minutes Intravenous  Once 02/09/22 0529 02/09/22 1015       Subjective: Seen and examined at bedside is doing much better.  He has been weaned to supplemental oxygen and denies any chest pain or shortness of breath.  No nausea or vomiting.  Denies any other concerns or complaints at this time.  Objective: Vitals:   02/10/22 0800 02/10/22 0900 02/10/22 1200 02/10/22 1435  BP: (!) 143/82 138/88 138/77   Pulse: 69 84 77   Resp: 11 18 (!) 22   Temp:    98.3 F (36.8 C)  TempSrc:    Oral  SpO2: 97% 91% 93%   Weight:      Height:        Intake/Output Summary (Last 24 hours) at 02/10/2022 1529 Last data filed at 02/09/2022 2235 Gross per 24 hour  Intake 100 ml  Output --  Net 100 ml   Filed Weights   02/09/22 0318  Weight: 68.5 kg   Examination: Physical Exam:  Constitutional: WN/WD Caucasian male currently no acute distress Respiratory: Diminished to auscultation bilaterally with coarse breath sounds in the anterior lung fields some slight wheezing, no wheezing, rales, rhonchi or crackles. Normal respiratory effort and patient is not tachypenic. No accessory muscle use.  Unlabored breathing Cardiovascular: RRR, no  murmurs / rubs / gallops. S1 and S2 auscultated.  Abdomen: Soft, non-tender, nontender.  Bowel sounds positive.  GU: Deferred. Musculoskeletal: No clubbing / cyanosis of digits/nails. No joint deformity upper and lower extremities.  Skin: No rashes, lesions, ulcers on limited skin evaluation. No induration; Warm and dry.  Neurologic: CN 2-12 grossly intact with no focal deficits. . Romberg sign cerebellar reflexes not assessed.  Psychiatric: Normal judgment and insight. Alert and oriented x 3. Normal mood and appropriate affect.   Data Reviewed: I have personally reviewed following labs and imaging studies  CBC: Recent Labs  Lab 02/09/22 0329 02/09/22 1311 02/10/22 0552  WBC 7.6 13.5* 7.7  NEUTROABS 6.4  --  6.4  HGB 16.2 15.6 14.0  HCT 47.9 46.9 41.6  MCV 98.6 97.9 98.3  PLT 214 222 187   Basic Metabolic  Panel: Recent Labs  Lab 02/09/22 0329 02/09/22 1311 02/10/22 0552  NA 141  --  140  K 3.8  --  3.9  CL 112*  --  110  CO2 22  --  25  GLUCOSE 143*  --  112*  BUN 16  --  17  CREATININE 0.70 0.73 0.71  CALCIUM 8.3*  --  8.0*  MG  --   --  2.3  PHOS  --   --  2.0*   GFR: Estimated Creatinine Clearance: 77.5 mL/min (by C-G formula based on SCr of 0.71 mg/dL). Liver Function Tests: Recent Labs  Lab 02/09/22 0329 02/10/22 0552  AST 24 16  ALT 25 20  ALKPHOS 65 47  BILITOT 0.9 0.6  PROT 6.0* 5.5*  ALBUMIN 3.4* 3.0*   Recent Labs  Lab 02/09/22 0329  LIPASE 32   No results for input(s): "AMMONIA" in the last 168 hours. Coagulation Profile: No results for input(s): "INR", "PROTIME" in the last 168 hours. Cardiac Enzymes: No results for input(s): "CKTOTAL", "CKMB", "CKMBINDEX", "TROPONINI" in the last 168 hours. BNP (last 3 results) No results for input(s): "PROBNP" in the last 8760 hours. HbA1C: No results for input(s): "HGBA1C" in the last 72 hours. CBG: No results for input(s): "GLUCAP" in the last 168 hours. Lipid Profile: No results for input(s):  "CHOL", "HDL", "LDLCALC", "TRIG", "CHOLHDL", "LDLDIRECT" in the last 72 hours. Thyroid Function Tests: No results for input(s): "TSH", "T4TOTAL", "FREET4", "T3FREE", "THYROIDAB" in the last 72 hours. Anemia Panel: Recent Labs    02/09/22 1311  FERRITIN 122   Sepsis Labs: Recent Labs  Lab 02/09/22 0329 02/09/22 0747  LATICACIDVEN 2.4* 1.1   Recent Results (from the past 240 hour(s))  Resp panel by RT-PCR (RSV, Flu A&B, Covid) Anterior Nasal Swab     Status: None   Collection Time: 02/09/22  3:30 AM   Specimen: Anterior Nasal Swab  Result Value Ref Range Status   SARS Coronavirus 2 by RT PCR NEGATIVE NEGATIVE Final    Comment: (NOTE) SARS-CoV-2 target nucleic acids are NOT DETECTED.  The SARS-CoV-2 RNA is generally detectable in upper respiratory specimens during the acute phase of infection. The lowest concentration of SARS-CoV-2 viral copies this assay can detect is 138 copies/mL. A negative result does not preclude SARS-Cov-2 infection and should not be used as the sole basis for treatment or other patient management decisions. A negative result may occur with  improper specimen collection/handling, submission of specimen other than nasopharyngeal swab, presence of viral mutation(s) within the areas targeted by this assay, and inadequate number of viral copies(<138 copies/mL). A negative result must be combined with clinical observations, patient history, and epidemiological information. The expected result is Negative.  Fact Sheet for Patients:  BloggerCourse.com  Fact Sheet for Healthcare Providers:  SeriousBroker.it  This test is no t yet approved or cleared by the [place] FDA and  has been authorized for detection and/or diagnosis of SARS-CoV-2 by FDA under an Emergency Use Authorization (EUA). This EUA will remain  in effect (meaning this test can be used) for the duration of the COVID-19 declaration under  Section 564(b)(1) of the Act, 21 U.S.C.section 360bbb-3(b)(1), unless the authorization is terminated  or revoked sooner.       Influenza A by PCR NEGATIVE NEGATIVE Final   Influenza B by PCR NEGATIVE NEGATIVE Final    Comment: (NOTE) The Xpert Xpress SARS-CoV-2/FLU/RSV plus assay is intended as an aid in the diagnosis of influenza from Nasopharyngeal swab specimens and  should not be used as a sole basis for treatment. Nasal washings and aspirates are unacceptable for Xpert Xpress SARS-CoV-2/FLU/RSV testing.  Fact Sheet for Patients: BloggerCourse.com  Fact Sheet for Healthcare Providers: SeriousBroker.it  This test is not yet approved or cleared by the [place] FDA and has been authorized for detection and/or diagnosis of SARS-CoV-2 by FDA under an Emergency Use Authorization (EUA). This EUA will remain in effect (meaning this test can be used) for the duration of the COVID-19 declaration under Section 564(b)(1) of the Act, 21 U.S.C. section 360bbb-3(b)(1), unless the authorization is terminated or revoked.     Resp Syncytial Virus by PCR NEGATIVE NEGATIVE Final    Comment: (NOTE) Fact Sheet for Patients: BloggerCourse.com  Fact Sheet for Healthcare Providers: SeriousBroker.it  This test is not yet approved or cleared by the [place] FDA and has been authorized for detection and/or diagnosis of SARS-CoV-2 by FDA under an Emergency Use Authorization (EUA). This EUA will remain in effect (meaning this test can be used) for the duration of the COVID-19 declaration under Section 564(b)(1) of the Act, 21 U.S.C. section 360bbb-3(b)(1), unless the authorization is terminated or revoked.  Performed at [facility], [address] [place], [place] [address]   Blood culture (routine single)     Status: None (Preliminary result)   Collection Time: 02/09/22   4:34 AM   Specimen: BLOOD  Result Value Ref Range Status   Specimen Description BLOOD BLOOD RIGHT ARM  Final   Special Requests   Final    BOTTLES DRAWN AEROBIC AND ANAEROBIC Blood Culture adequate volume   Culture   Final    NO GROWTH 1 DAY Performed at [facility], [address] [place], [place] [address]    Report Status PENDING  Incomplete  Blood culture (single)     Status: None (Preliminary result)   Collection Time: 02/09/22  4:34 AM   Specimen: BLOOD  Result Value Ref Range Status   Specimen Description BLOOD LEFT ANTECUBITAL  Final   Special Requests   Final    BOTTLES DRAWN AEROBIC AND ANAEROBIC Blood Culture adequate volume   Culture   Final    NO GROWTH 1 DAY Performed at [facility], [address], [place], [place] [address]    Report Status PENDING  Incomplete  Respiratory (~20 pathogens) panel by PCR     Status: None   Collection Time: 02/09/22  1:11 PM   Specimen: Nasopharyngeal Swab; Respiratory  Result Value Ref Range Status   Adenovirus NOT DETECTED NOT DETECTED Final   Coronavirus 229E NOT DETECTED NOT DETECTED Final    Comment: (NOTE) The Coronavirus on the Respiratory Panel, DOES NOT test for the novel  Coronavirus (2019 nCoV)    Coronavirus HKU1 NOT DETECTED NOT DETECTED Final   Coronavirus NL63 NOT DETECTED NOT DETECTED Final   Coronavirus OC43 NOT DETECTED NOT DETECTED Final   Metapneumovirus NOT DETECTED NOT DETECTED Final   Rhinovirus / Enterovirus NOT DETECTED NOT DETECTED Final   Influenza A NOT DETECTED NOT DETECTED Final   Influenza B NOT DETECTED NOT DETECTED Final   Parainfluenza Virus 1 NOT DETECTED NOT DETECTED Final   Parainfluenza Virus 2 NOT DETECTED NOT DETECTED Final   Parainfluenza Virus 3 NOT DETECTED NOT DETECTED Final   Parainfluenza Virus 4 NOT DETECTED NOT DETECTED Final   Respiratory Syncytial Virus NOT DETECTED NOT DETECTED Final   Bordetella pertussis NOT DETECTED NOT DETECTED Final   Bordetella  Parapertussis NOT DETECTED NOT  DETECTED Final   Chlamydophila pneumoniae NOT DETECTED NOT DETECTED Final   Mycoplasma pneumoniae NOT DETECTED NOT DETECTED Final    Comment: Performed at [facility], [address], [place], [place] [address]  Surgical pcr screen     Status: Abnormal   Collection Time: 02/09/22  8:56 PM   Specimen: Nasal Mucosa; Nasal Swab  Result Value Ref Range Status   MRSA, PCR NEGATIVE NEGATIVE Final   Staphylococcus aureus POSITIVE (A) NEGATIVE Final    Comment: (NOTE) The Xpert SA Assay (FDA approved for NASAL specimens in patients 49 years of age and older), is one component of a comprehensive surveillance program. It is not intended to diagnose infection nor to guide or monitor treatment. Performed at [facility], [address] [place], [place] [address]     Radiology Studies: DG Chest Helper 1 View  Result Date: 02/10/2022 CLINICAL DATA:  Shortness of breath EXAM: PORTABLE CHEST 1 VIEW COMPARISON:  Film from the previous day. FINDINGS: Cardiac shadow is stable. Lungs are well aerated bilaterally. Emphysematous changes are seen. Slight improved aeration is noted when compared with the prior exam in the bases. No acute bony abnormality is noted. IMPRESSION: Slight improved aeration in the bases when compared with the prior exam. Electronically Signed   By: Alcide Clever M.D.   On: 02/10/2022 02:41   DG Chest Port 1 View  Result Date: 02/09/2022 CLINICAL DATA:  71 year old male with history of respiratory distress low oxygen saturation. Productive cough. EXAM: PORTABLE CHEST 1 VIEW COMPARISON:  Chest x-ray 02/19/2021. FINDINGS: Emphysematous changes are noted throughout the lungs bilaterally. Patchy areas of interstitial prominence, peribronchial cuffing and ill-defined airspace disease noted in the lungs, most evident in the lower lungs (right-greater-than-left). Small right pleural effusion. No definite left pleural effusion. Areas of linear  scarring are noted in the upper lobes of the lungs bilaterally (left-greater-than-right). No evidence of pulmonary edema. No definite pneumothorax. Heart size is normal. Upper mediastinal contours are within normal limits. Atherosclerotic calcifications are noted in the thoracic aorta. IMPRESSION: 1. The appearance the chest is concerning for severe bronchitis with developing multilobar bilateral bronchopneumonia (right greater than left). 2. Small right pleural effusion. 3. Aortic atherosclerosis. 4. Emphysema. Electronically Signed   By: Trudie Reed M.D.   On: 02/09/2022 05:08    Scheduled Meds:  atorvastatin  20 mg Oral QHS   budesonide (PULMICORT) nebulizer solution  0.25 mg Nebulization BID   doxycycline  100 mg Oral Q12H   enoxaparin (LOVENOX) injection  40 mg Subcutaneous Q24H   guaiFENesin  1,200 mg Oral BID   ipratropium-albuterol  3 mL Nebulization TID   phosphorus  500 mg Oral BID   predniSONE  50 mg Oral Q breakfast   Continuous Infusions:   LOS: 1 day   Marguerita Merles, DO [facility] Available via Epic secure chat 7am-7pm After these hours, please refer to coverage provider listed on [email or web address] 02/10/2022, 3:29 PM

## 2022-02-10 NOTE — ED Notes (Addendum)
This RN at bedside, pt has [place] on but oxygen is turned off.  Pt sats at 92%.

## 2022-02-10 NOTE — ED Notes (Signed)
SPO2 to 85% while eating, 2lpm [place] applied.

## 2022-02-11 ENCOUNTER — Other Ambulatory Visit: Payer: Self-pay

## 2022-02-11 ENCOUNTER — Inpatient Hospital Stay: Payer: Managed Care, Other (non HMO)

## 2022-02-11 DIAGNOSIS — J180 Bronchopneumonia, unspecified organism: Secondary | ICD-10-CM | POA: Diagnosis not present

## 2022-02-11 DIAGNOSIS — D72829 Elevated white blood cell count, unspecified: Secondary | ICD-10-CM | POA: Diagnosis not present

## 2022-02-11 DIAGNOSIS — J9601 Acute respiratory failure with hypoxia: Secondary | ICD-10-CM | POA: Diagnosis not present

## 2022-02-11 LAB — CBC WITH DIFFERENTIAL/PLATELET
Abs Immature Granulocytes: 0.04 10*3/uL (ref 0.00–0.07)
Basophils Absolute: 0 10*3/uL (ref 0.0–0.1)
Basophils Relative: 0 %
Eosinophils Absolute: 0 10*3/uL (ref 0.0–0.5)
Eosinophils Relative: 0 %
HCT: 42.6 % (ref 39.0–52.0)
Hemoglobin: 14.6 g/dL (ref 13.0–17.0)
Immature Granulocytes: 1 %
Lymphocytes Relative: 10 %
Lymphs Abs: 0.8 10*3/uL (ref 0.7–4.0)
MCH: 33.3 pg (ref 26.0–34.0)
MCHC: 34.3 g/dL (ref 30.0–36.0)
MCV: 97.3 fL (ref 80.0–100.0)
Monocytes Absolute: 0.6 10*3/uL (ref 0.1–1.0)
Monocytes Relative: 7 %
Neutro Abs: 6.3 10*3/uL (ref 1.7–7.7)
Neutrophils Relative %: 82 %
Platelets: 251 10*3/uL (ref 150–400)
RBC: 4.38 MIL/uL (ref 4.22–5.81)
RDW: 12.9 % (ref 11.5–15.5)
WBC: 7.7 10*3/uL (ref 4.0–10.5)
nRBC: 0 % (ref 0.0–0.2)

## 2022-02-11 LAB — COMPREHENSIVE METABOLIC PANEL
ALT: 24 U/L (ref 0–44)
AST: 18 U/L (ref 15–41)
Albumin: 3.2 g/dL — ABNORMAL LOW (ref 3.5–5.0)
Alkaline Phosphatase: 53 U/L (ref 38–126)
Anion gap: 7 (ref 5–15)
BUN: 18 mg/dL (ref 8–23)
CO2: 28 mmol/L (ref 22–32)
Calcium: 8.8 mg/dL — ABNORMAL LOW (ref 8.9–10.3)
Chloride: 107 mmol/L (ref 98–111)
Creatinine, Ser: 0.68 mg/dL (ref 0.61–1.24)
GFR, Estimated: >60 mL/min (ref >60)
Glucose, Bld: 100 mg/dL — ABNORMAL HIGH (ref 70–99)
Potassium: 3.9 mmol/L (ref 3.5–5.1)
Sodium: 142 mmol/L (ref 135–145)
Total Bilirubin: 0.6 mg/dL (ref 0.3–1.2)
Total Protein: 6.2 g/dL — ABNORMAL LOW (ref 6.5–8.1)

## 2022-02-11 LAB — PHOSPHORUS: Phosphorus: 3 mg/dL (ref 2.5–4.6)

## 2022-02-11 LAB — MAGNESIUM: Magnesium: 2.2 mg/dL (ref 1.7–2.4)

## 2022-02-11 MED ORDER — ACETAMINOPHEN 325 MG PO TABS: 650.0000 mg | ORAL_TABLET | Freq: Four times a day (QID) | ORAL | 0 refills | Status: AC | PRN

## 2022-02-11 MED ORDER — ONDANSETRON HCL 4 MG PO TABS: 4.0000 mg | ORAL_TABLET | Freq: Four times a day (QID) | ORAL | 0 refills | Status: AC | PRN

## 2022-02-11 MED ORDER — MUPIROCIN 2 % EX OINT: 1.0000 | TOPICAL_OINTMENT | Freq: Two times a day (BID) | CUTANEOUS | 0 refills | Status: AC

## 2022-02-11 MED ORDER — ALBUTEROL SULFATE HFA 108 (90 BASE) MCG/ACT IN AERS: 2.0000 | INHALATION_SPRAY | RESPIRATORY_TRACT | 0 refills | Status: AC | PRN

## 2022-02-11 MED ORDER — DOXYCYCLINE HYCLATE 100 MG PO TABS: 100.0000 mg | ORAL_TABLET | Freq: Two times a day (BID) | ORAL | 0 refills | Status: AC

## 2022-02-11 MED ORDER — MUPIROCIN 2 % EX OINT
1.0000 | TOPICAL_OINTMENT | Freq: Two times a day (BID) | CUTANEOUS | Status: DC
  Administered 2022-02-11: 1 via NASAL
  Filled 2022-02-11: qty 22

## 2022-02-11 MED ORDER — IPRATROPIUM-ALBUTEROL 0.5-2.5 (3) MG/3ML IN SOLN: 3.0000 mL | Freq: Four times a day (QID) | RESPIRATORY_TRACT | 0 refills | Status: AC | PRN

## 2022-02-11 MED ORDER — PREDNISONE 5 MG PO TABS: ORAL_TABLET | ORAL | 0 refills | Status: AC

## 2022-02-11 MED ORDER — CHLORHEXIDINE GLUCONATE CLOTH 2 % EX PADS
6.0000 | MEDICATED_PAD | Freq: Every day | CUTANEOUS | Status: DC
  Administered 2022-02-11: 6 via TOPICAL

## 2022-02-11 NOTE — Progress Notes (Signed)
SATURATION QUALIFICATIONS: (This note is used to comply with regulatory documentation for home oxygen)  Patient Saturations on Room Air at Rest = 86%  Patient Saturations on Room Air while Ambulating = 70%  Patient Saturations on 2L [place] at rest= 93%  Patient Saturations on 3 Liters of oxygen while Ambulating = 91%  Please briefly explain why patient needs home oxygen:  Patient with COPD exacerbation. Needs O2 to maintain saturations.

## 2022-02-11 NOTE — TOC Transition Note (Signed)
Transition of Care [facility]) - CM/SW Discharge Note   Patient Details  Name: Christian Shelton MRN: [id number] Date of Birth: 05-26-1950  [facility] of [facility]) CM/SW Contact:  Kemper Durie, RN Phone Number: 02/11/2022, 10:15 AM   Clinical Narrative:     Notified patient medically ready for discharge, will need home oxygen.  He lives home with wife and son, Dr. Judithann Sheen is PCP, obtains medications from [facility].  Per son, has nebulizers, uses no other DME in the home.  Agrees to have [employer] supply oxygen, Jasmine aware and will have oxygen delivered.  Son will provide transportation home, does not have any other questions at this time.   Final next level of care: Home/Self Care Barriers to Discharge: Barriers Resolved   Patient Goals and CMS Choice CMS Medicare.gov Compare Post Acute Care list provided to:: Patient    Discharge Placement                         Discharge Plan and Services Additional resources added to the After Visit Summary for       Post Acute Care Choice: Durable Medical Equipment          DME Arranged: Oxygen DME Agency: [employer] Date DME Agency Contacted: 02/11/22 Time DME Agency Contacted: 1014 Representative spoke with at DME Agency: Leavy Cella            Social Determinants of Health (SDOH) Interventions SDOH Screenings   Food Insecurity: No Food Insecurity (02/11/2022)  Housing: Low Risk  (02/11/2022)  Transportation Needs: No Transportation Needs (02/11/2022)  Utilities: Not At Risk (02/11/2022)  Tobacco Use: Medium Risk (02/09/2022)     Readmission Risk Interventions     No data to display

## 2022-02-11 NOTE — Progress Notes (Signed)
Patient awaiting home O2 set up and eval by PT for d/c

## 2022-02-11 NOTE — Evaluation (Signed)
Physical Therapy Evaluation Patient Details Name: Christian Shelton MRN: [id number] DOB: October 18, 1950 Today's Date: 02/11/2022  History of Present Illness  Pt is a 71 y.o. male with medical history significant of hypertension, COPD not on oxygen, dyslipidemia and osteoarthritis.  Patient reports that for about 3 days prior to coming in he had fever and chills with nonproductive cough and generalized feeling of malaise. MD assessment includes: Acute respiratory failure with hypoxia in the setting of severe bronchitis with multilobar B/L broncho PNA, COPD exacerbation, hypophosphatemia, and hypoalbuminemia.   Clinical Impression  Pt was pleasant and motivated to participate during the session and put forth good effort throughout. Pt found on 2LO2/min with resting SpO2  95%.  Pt's SpO2 after activity on 2LO2/min as follow: Amb 50 feet 92%, Amb 100 feet 90%, Amb 150 feet 89%, up/down 4 stairs 94%, HR WNL with little to no SOB throughout.  Pt's SpO2 quickly increased back to mid 90s at rest after each activity session.  Pt Ind with all functional tasks and demonstrated very good control and stability with transfers, gait, and stairs, no skilled PT needs identified at this time.  Will complete PT orders at this time but will reassess pt pending a change in status upon receipt of new PT orders.         Recommendations for follow up therapy are one component of a multi-disciplinary discharge planning process, led by the attending physician.  Recommendations may be updated based on patient status, additional functional criteria and insurance authorization.  Follow Up Recommendations No PT follow up      Assistance Recommended at Discharge PRN  Patient can return home with the following  Assist for transportation    Equipment Recommendations None recommended by PT  Recommendations for Other Services       Functional Status Assessment Patient has not had a recent decline in their functional status      Precautions / Restrictions Precautions Precautions: None Restrictions Weight Bearing Restrictions: No      Mobility  Bed Mobility Overal bed mobility: Independent                  Transfers Overall transfer level: Independent                 General transfer comment: Good eccentric and concentric control and stability    Ambulation/Gait Ambulation/Gait assistance: Independent Gait Distance (Feet): 150 Feet x 1, 100 Feet x 1, 50 Feet x 1 Assistive device: None Gait Pattern/deviations: WFL(Within Functional Limits) Gait velocity: WNL     General Gait Details: Good stability with gait without an AD including during start/stops and 180 deg turns  Stairs Stairs: Yes Stairs assistance: Modified independent (Device/Increase time) Stair Management: One rail Right, Forwards, Alternating pattern Number of Stairs: 4 General stair comments: Good control and stability with ascending/descending steps  Wheelchair Mobility    Modified Rankin (Stroke Patients Only)       Balance Overall balance assessment: No apparent balance deficits (not formally assessed)                                           Pertinent Vitals/Pain Pain Assessment Pain Assessment: No/denies pain    Home Living Family/patient expects to be discharged to:: Private residence Living Arrangements: Spouse/significant other;Children Available Help at Discharge: Family;Available 24 hours/day Type of Home: House Home Access: Stairs to enter Entrance Stairs-Rails:  Right Entrance Stairs-Number of Steps: 2   Home Layout: One level Home Equipment: None      Prior Function Prior Level of Function : Independent/Modified Independent             Mobility Comments: Ind amb without AD community distances, works for [employer] with frequent ambulation, no fall history ADLs Comments: Ind with ADLs     Hand Dominance        Extremity/Trunk Assessment   Upper Extremity  Assessment Upper Extremity Assessment: Overall WFL for tasks assessed    Lower Extremity Assessment Lower Extremity Assessment: Overall WFL for tasks assessed       Communication   Communication: No difficulties  Cognition Arousal/Alertness: Awake/alert Behavior During Therapy: WFL for tasks assessed/performed Overall Cognitive Status: Within Functional Limits for tasks assessed                                          General Comments      Exercises Other Exercises Other Exercises: Pt education provided on sitting upright during the day as tolerated Other Exercises: Pt education on hourly bouts of activity with min to mod RPE   Assessment/Plan    PT Assessment Patient does not need any further PT services  PT Problem List         PT Treatment Interventions      PT Goals (Current goals can be found in the Care Plan section)  Acute Rehab PT Goals PT Goal Formulation: All assessment and education complete, DC therapy    Frequency       Co-evaluation               AM-PAC PT "6 Clicks" Mobility  Outcome Measure Help needed turning from your back to your side while in a flat bed without using bedrails?: None Help needed moving from lying on your back to sitting on the side of a flat bed without using bedrails?: None Help needed moving to and from a bed to a chair (including a wheelchair)?: None Help needed standing up from a chair using your arms (e.g., wheelchair or bedside chair)?: None Help needed to walk in hospital room?: None Help needed climbing 3-5 steps with a railing? : None 6 Click Score: 24    End of Session Equipment Utilized During Treatment: Gait belt;Oxygen Activity Tolerance: Patient tolerated treatment well Patient left: in chair;with call bell/phone within reach;with family/visitor present Nurse Communication: Mobility status PT Visit Diagnosis: Muscle weakness (generalized) (M62.81);Difficulty in walking, not elsewhere  classified (R26.2)    Time: 3354-5625 PT Time Calculation (min) (ACUTE ONLY): 27 min   Charges:   PT Evaluation $PT Eval Moderate Complexity: 1 Mod PT Treatments $Therapeutic Activity: 8-22 mins       D. Elly Modena PT, DPT 02/11/22, 11:37 AM

## 2022-02-11 NOTE — Discharge Summary (Signed)
Physician Discharge Summary   Patient: Christian Shelton MRN: [id number] DOB: [birth year]  Admit date:     02/09/2022  Discharge date: 02/11/22  Discharge Physician: Marguerita Merlesmair Mavis Gravelle, DO   PCP: Marguarite ArbourSparks, Jeffrey D, MD   Recommendations at discharge:  {Tip this will not be part of the note when signed- Example include specific recommendations for outpatient follow-up, pending tests to follow-up on. (Optional):26781}  ***  Discharge Diagnoses: Principal Problem:   Acute respiratory failure with hypoxia (HCC) Active Problems:   Acute respiratory failure (HCC)  Resolved Problems:   * No resolved hospital problems. [facility]*  Hospital Course: The patient is a 71 year old Caucasian male with past medical history significant for but not to hypertension, COPD not on home oxygen, dyslipidemia, osteoarthritis as well as other comorbidities who reports 3 days of fevers and chills with nonproductive cough prior to coming in to the hospital.  He felt generalized malaise and EMS was called and upon evaluation on room air he is found to have saturations in the 60s.  He was given nebulizer and started on CPAP in the field he was noted to have significant dyspnea while speaking.  After arrival to the ED he had increased work of breathing and was transitioned to BiPAP and has initial lactic acid level was slightly elevated.  He did not have a leukocytosis on admission and his BNP was normal.  Chest x-ray was concerning for severe bronchitis with evolving multilobar bilateral bronchopneumonia worse on the right compared to left.  He was admitted for acute respiratory failure with hypoxia in the setting of severe bronchitis with multilobar bilateral bronchopneumonia and acute COPD exacerbation and initiated on steroids and antibiotics as well as supplemental oxygen.  He is also placed on nebs and pulmonary was consulted and he is improving.  He is now since been weaned off of BiPAP and desaturated on room air so had to be placed  on 2 L of supplemental oxygen nasal cannula.    Assessment and Plan:  Acute Respiratory Failure with Hypoxia in the setting of Severe Bronchitis with Multilobar B/L BronchoPNA and COPD -Patient presents with SOB and a Room Air Saturation <90% (60%) -SpO2: 93 % O2 Flow Rate (L/min): 2 L/min FiO2 (%): 35 % -Continuous Pulse Oximetry and Maintain O2 Saturations >90% -Continue Supplemental O2 via [place] and Wean O2 as Tolerated -Will need an Ambulatory Home O2 Screen prior to D/C and repeat CXR in the AM  -CXR this AM showed "Cardiac shadow is stable. Lungs are well aerated bilaterally. Emphysematous changes are seen. Slight improved aeration is noted when compared with the prior exam in the bases. No acute bony abnormality is noted." -Staphylococcus Aureus was + -PT/OT to Evaluate and Treat   Lactic Acidosis -Patient's LA went from 2.4 -> 1.1 -Continue to Monitor and Trend -Repeat in the AM    Acute Exacerbation of COPD  -See as above -Respiratory virus panel was ordered by PCCM this was negative -Add Flutter Valve, Incentive Spirometry, Guaifenesin  -C/w Abx and Steroids; his antibiotics have not been de-escalated to doxycycline and steroids have been de-escalated to prednisone -Wean O2 as tolerated   Severe Bronchitis with Multilobar B/L BronchoPNA  -C/w Abx and Treatment as above -WBC went from 7.6 -> 13.5 -> 7.7 -CRP was 7.1 and  -Obtain Sputum Sample  -Repeat CXR in the AM    Hypertension -Based on medication reconciliation was not taking medication -Continue to Monitor BP per Protocol -Last BP reading was 138/77   Dyslipidemia -Hold  home meds while n.p.o. on BiPAP and will now resume   History of Gout -Continue colchicine -Currently no acute exacerbation noted   Hypophosphatemia -Patient's Phos Level was 2.0 -Replete with po K Phos 500 mg po BID -Repeat Phos Level in the AM    Hypoalbuminemia -Patient's Albumin Level went from 3.4 -> 3.0 -Continue to Monitor and  Trend -Repeat CMP in the AM     {(NOTE) Pain control PDMP Statment (Optional):26782} Consultants: *** Procedures performed: ***  Disposition: {Plan; Disposition:26390} Diet recommendation:  {Diet_Plan:26776} DISCHARGE MEDICATION: Allergies as of 02/11/2022       Reactions   Celebrex [celecoxib] Nausea Only   Solu-medrol [methylprednisolone] Other (See Comments)   syncope     Med Rec must be completed prior to using this [facility]***       Discharge Exam: Filed Weights   02/09/22 0318 02/11/22 0500  Weight: 68.5 kg 64.2 kg   ***  Condition at discharge: {DC Condition:26389}  The results of significant diagnostics from this hospitalization (including imaging, microbiology, ancillary and laboratory) are listed below for reference.   Imaging Studies: DG Chest Port 1 View  Result Date: 02/11/2022 CLINICAL DATA:  71 year old male with history of shortness of breath. EXAM: PORTABLE CHEST 1 VIEW COMPARISON:  Chest x-ray 02/10/2022. FINDINGS: Lungs are hyperexpanded with emphysematous changes. Widespread but scattered areas of interstitial prominence, peribronchial cuffing and poorly defined opacities are noted throughout the lungs bilaterally, most severe throughout the right mid to lower lung, and in the left upper lobe, concerning for bronchitis and multilobar bilateral bronchopneumonia. No definite pleural effusions. No pneumothorax. No evidence of pulmonary edema. Heart size is normal. Upper mediastinal contours are within normal limits. Atherosclerotic calcifications are noted in the thoracic aorta. IMPRESSION: 1. The appearance of the chest suggests bronchitis and multilobar bilateral bronchopneumonia, as above. 2. Aortic atherosclerosis. Electronically Signed   By: Trudie Reed M.D.   On: 02/11/2022 06:04   DG Chest Port 1 View  Result Date: 02/10/2022 CLINICAL DATA:  Shortness of breath EXAM: PORTABLE CHEST 1 VIEW COMPARISON:  Film from the previous day. FINDINGS:  Cardiac shadow is stable. Lungs are well aerated bilaterally. Emphysematous changes are seen. Slight improved aeration is noted when compared with the prior exam in the bases. No acute bony abnormality is noted. IMPRESSION: Slight improved aeration in the bases when compared with the prior exam. Electronically Signed   By: Alcide Clever M.D.   On: 02/10/2022 02:41   DG Chest Port 1 View  Result Date: 02/09/2022 CLINICAL DATA:  71 year old male with history of respiratory distress low oxygen saturation. Productive cough. EXAM: PORTABLE CHEST 1 VIEW COMPARISON:  Chest x-ray 02/19/2021. FINDINGS: Emphysematous changes are noted throughout the lungs bilaterally. Patchy areas of interstitial prominence, peribronchial cuffing and ill-defined airspace disease noted in the lungs, most evident in the lower lungs (right-greater-than-left). Small right pleural effusion. No definite left pleural effusion. Areas of linear scarring are noted in the upper lobes of the lungs bilaterally (left-greater-than-right). No evidence of pulmonary edema. No definite pneumothorax. Heart size is normal. Upper mediastinal contours are within normal limits. Atherosclerotic calcifications are noted in the thoracic aorta. IMPRESSION: 1. The appearance the chest is concerning for severe bronchitis with developing multilobar bilateral bronchopneumonia (right greater than left). 2. Small right pleural effusion. 3. Aortic atherosclerosis. 4. Emphysema. Electronically Signed   By: Trudie Reed M.D.   On: 02/09/2022 05:08    Microbiology: Results for orders placed or performed during the hospital encounter of 02/09/22  Resp  panel by RT-PCR (RSV, Flu A&B, Covid) Anterior Nasal Swab     Status: None   Collection Time: 02/09/22  3:30 AM   Specimen: Anterior Nasal Swab  Result Value Ref Range Status   SARS Coronavirus 2 by RT PCR NEGATIVE NEGATIVE Final    Comment: (NOTE) SARS-CoV-2 target nucleic acids are NOT DETECTED.  The SARS-CoV-2  RNA is generally detectable in upper respiratory specimens during the acute phase of infection. The lowest concentration of SARS-CoV-2 viral copies this assay can detect is 138 copies/mL. A negative result does not preclude SARS-Cov-2 infection and should not be used as the sole basis for treatment or other patient management decisions. A negative result may occur with  improper specimen collection/handling, submission of specimen other than nasopharyngeal swab, presence of viral mutation(s) within the areas targeted by this assay, and inadequate number of viral copies(<138 copies/mL). A negative result must be combined with clinical observations, patient history, and epidemiological information. The expected result is Negative.  Fact Sheet for Patients:  BloggerCourse.com  Fact Sheet for Healthcare Providers:  SeriousBroker.it  This test is no t yet approved or cleared by the [place] FDA and  has been authorized for detection and/or diagnosis of SARS-CoV-2 by FDA under an Emergency Use Authorization (EUA). This EUA will remain  in effect (meaning this test can be used) for the duration of the COVID-19 declaration under Section 564(b)(1) of the Act, 21 U.S.C.section 360bbb-3(b)(1), unless the authorization is terminated  or revoked sooner.       Influenza A by PCR NEGATIVE NEGATIVE Final   Influenza B by PCR NEGATIVE NEGATIVE Final    Comment: (NOTE) The Xpert Xpress SARS-CoV-2/FLU/RSV plus assay is intended as an aid in the diagnosis of influenza from Nasopharyngeal swab specimens and should not be used as a sole basis for treatment. Nasal washings and aspirates are unacceptable for Xpert Xpress SARS-CoV-2/FLU/RSV testing.  Fact Sheet for Patients: BloggerCourse.com  Fact Sheet for Healthcare Providers: SeriousBroker.it  This test is not yet approved or cleared by the  [place] FDA and has been authorized for detection and/or diagnosis of SARS-CoV-2 by FDA under an Emergency Use Authorization (EUA). This EUA will remain in effect (meaning this test can be used) for the duration of the COVID-19 declaration under Section 564(b)(1) of the Act, 21 U.S.C. section 360bbb-3(b)(1), unless the authorization is terminated or revoked.     Resp Syncytial Virus by PCR NEGATIVE NEGATIVE Final    Comment: (NOTE) Fact Sheet for Patients: BloggerCourse.com  Fact Sheet for Healthcare Providers: SeriousBroker.it  This test is not yet approved or cleared by the [place] FDA and has been authorized for detection and/or diagnosis of SARS-CoV-2 by FDA under an Emergency Use Authorization (EUA). This EUA will remain in effect (meaning this test can be used) for the duration of the COVID-19 declaration under Section 564(b)(1) of the Act, 21 U.S.C. section 360bbb-3(b)(1), unless the authorization is terminated or revoked.  Performed at [facility], [address] [place], [place] [address]   Blood culture (routine single)     Status: None (Preliminary result)   Collection Time: 02/09/22  4:34 AM   Specimen: BLOOD  Result Value Ref Range Status   Specimen Description BLOOD BLOOD RIGHT ARM  Final   Special Requests   Final    BOTTLES DRAWN AEROBIC AND ANAEROBIC Blood Culture adequate volume   Culture   Final    NO GROWTH 1 DAY Performed at [facility], [address]  [place], [place] [address]    Report Status PENDING  Incomplete  Blood culture (single)     Status: None (Preliminary result)   Collection Time: 02/09/22  4:34 AM   Specimen: BLOOD  Result Value Ref Range Status   Specimen Description BLOOD LEFT ANTECUBITAL  Final   Special Requests   Final    BOTTLES DRAWN AEROBIC AND ANAEROBIC Blood Culture adequate volume   Culture   Final    NO GROWTH 1 DAY Performed at [facility], [address] [place], [place] [address]    Report Status PENDING  Incomplete  Respiratory (~20 pathogens) panel by PCR     Status: None   Collection Time: 02/09/22  1:11 PM   Specimen: Nasopharyngeal Swab; Respiratory  Result Value Ref Range Status   Adenovirus NOT DETECTED NOT DETECTED Final   Coronavirus 229E NOT DETECTED NOT DETECTED Final    Comment: (NOTE) The Coronavirus on the Respiratory Panel, DOES NOT test for the novel  Coronavirus (2019 nCoV)    Coronavirus HKU1 NOT DETECTED NOT DETECTED Final   Coronavirus NL63 NOT DETECTED NOT DETECTED Final   Coronavirus OC43 NOT DETECTED NOT DETECTED Final   Metapneumovirus NOT DETECTED NOT DETECTED Final   Rhinovirus / Enterovirus NOT DETECTED NOT DETECTED Final   Influenza A NOT DETECTED NOT DETECTED Final   Influenza B NOT DETECTED NOT DETECTED Final   Parainfluenza Virus 1 NOT DETECTED NOT DETECTED Final   Parainfluenza Virus 2 NOT DETECTED NOT DETECTED Final   Parainfluenza Virus 3 NOT DETECTED NOT DETECTED Final   Parainfluenza Virus 4 NOT DETECTED NOT DETECTED Final   Respiratory Syncytial Virus NOT DETECTED NOT DETECTED Final   Bordetella pertussis NOT DETECTED NOT DETECTED Final   Bordetella Parapertussis NOT DETECTED NOT DETECTED Final   Chlamydophila pneumoniae NOT DETECTED NOT DETECTED Final   Mycoplasma pneumoniae NOT DETECTED NOT DETECTED Final    Comment: Performed at [facility], [address] [place], [place] [address]  Surgical pcr screen     Status: Abnormal   Collection Time: 02/09/22  8:56 PM   Specimen: Nasal Mucosa; Nasal Swab  Result Value Ref Range Status   MRSA, PCR NEGATIVE NEGATIVE Final   Staphylococcus aureus POSITIVE (A) NEGATIVE Final    Comment: (NOTE) The Xpert SA Assay (FDA approved for NASAL specimens in patients 54 years of age and older), is one component of a comprehensive surveillance program. It is not intended to diagnose infection nor to guide or monitor  treatment. Performed at [facility], [address] [place], [place] [address]     Labs: CBC: Recent Labs  Lab 02/09/22 0329 02/09/22 1311 02/10/22 0552 02/11/22 0510  WBC 7.6 13.5* 7.7 7.7  NEUTROABS 6.4  --  6.4 6.3  HGB 16.2 15.6 14.0 14.6  HCT 47.9 46.9 41.6 42.6  MCV 98.6 97.9 98.3 97.3  PLT 214 222 187 251   Basic Metabolic Panel: Recent Labs  Lab 02/09/22 0329 02/09/22 1311 02/10/22 0552 02/11/22 0510  NA 141  --  140 142  K 3.8  --  3.9 3.9  CL 112*  --  110 107  CO2 22  --  25 28  GLUCOSE 143*  --  112* 100*  BUN 16  --  17 18  CREATININE 0.70 0.73 0.71 0.68  CALCIUM 8.3*  --  8.0* 8.8*  MG  --   --  2.3 2.2  PHOS  --   --  2.0* 3.0  Liver Function Tests: Recent Labs  Lab 02/09/22 0329 02/10/22 0552 02/11/22 0510  AST 24 16 18   ALT 25 20 24   ALKPHOS 65 47 53  BILITOT 0.9 0.6 0.6  PROT 6.0* 5.5* 6.2*  ALBUMIN 3.4* 3.0* 3.2*   CBG: Recent Labs  Lab 02/10/22 2058  GLUCAP 169*    Discharge time spent: {LESS THAN/GREATER THAN:26388} 30 minutes.  Signed: 02/12/22, DO [facility] 02/11/2022

## 2022-02-11 NOTE — Progress Notes (Addendum)
OT Cancellation Note  Patient Details Name: ORVA GWALTNEY MRN: [id number] DOB: 05/19/1950   Cancelled Treatment:    Reason Eval/Treat Not Completed: Other (comment) (per discussion with PT and nurse, pt has no OT needs at this time. Please reconsult if there is a change in functional status  Oleta Mouse, OTD OTR/L  02/11/22, 12:24 PM

## 2022-02-11 NOTE — Progress Notes (Signed)
Patient and son given discharged instructions. Verbalized understanding of all d/c instructions, including follow up appointments. Patient educated on use of home oxygen. Awaiting O2 delivery for discharge

## 2022-02-11 NOTE — Progress Notes (Signed)
Patient discharged in stable condition via w/c.

## 2022-02-14 LAB — CULTURE, BLOOD (SINGLE)
Culture: NO GROWTH
Culture: NO GROWTH
Special Requests: ADEQUATE
Special Requests: ADEQUATE
